# Patient Record
Sex: Male | Born: 1945 | Race: White | Hispanic: No | Smoking: Never smoker
Health system: Southern US, Community
[De-identification: ages and names within clinical notes are randomized; demographics above are authoritative.]

## PROBLEM LIST (undated history)

## (undated) DIAGNOSIS — F32A Depression, unspecified: Secondary | ICD-10-CM

## (undated) DIAGNOSIS — E785 Hyperlipidemia, unspecified: Secondary | ICD-10-CM

## (undated) DIAGNOSIS — F329 Major depressive disorder, single episode, unspecified: Secondary | ICD-10-CM

## (undated) HISTORY — DX: Hyperlipidemia, unspecified: E78.5

## (undated) HISTORY — DX: Depression, unspecified: F32.A

## (undated) HISTORY — PX: TONSILLECTOMY: SUR1361

---
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
## 2019-01-07 ENCOUNTER — Encounter: Payer: Self-pay | Admitting: Internal Medicine

## 2019-01-07 ENCOUNTER — Other Ambulatory Visit: Payer: Self-pay

## 2019-01-07 ENCOUNTER — Ambulatory Visit: Payer: Medicare Other | Admitting: Internal Medicine

## 2019-01-07 DIAGNOSIS — F329 Major depressive disorder, single episode, unspecified: Secondary | ICD-10-CM | POA: Insufficient documentation

## 2019-01-07 DIAGNOSIS — F32A Depression, unspecified: Secondary | ICD-10-CM | POA: Insufficient documentation

## 2019-01-07 DIAGNOSIS — F333 Major depressive disorder, recurrent, severe with psychotic symptoms: Secondary | ICD-10-CM | POA: Diagnosis not present

## 2019-01-07 DIAGNOSIS — E785 Hyperlipidemia, unspecified: Secondary | ICD-10-CM | POA: Diagnosis not present

## 2019-01-07 NOTE — Progress Notes (Signed)
New Patient Office Visit     CC/Reason for Visit: Establish care, discuss chronic medical conditions Previous PCP: At [facility] Last Visit: Fall 2019  HPI: Alexander Price is a 73 y.o. male who is coming in today for the above mentioned reasons. Past Medical History is significant for: Hyperlipidemia that has been well controlled on 40 mg of simvastatin.  His main history is psychiatric.  He has a [place] history of depression.  In 2019 he had a hospitalization in November and another one in February 2020 due to suicidal intent.  He has a [place] established relationship with a psychiatrist and a neurologist in [place].  His daughter accompanies him on his visit today.  He already has follow-up scheduled with local psychiatry in November.  Since his neurologist is only seeing him once a year, they will continue to visit him in [place].  He moved from [place] to [place] to be closer to his daughter due to his mental health issues.  He is living in independent living at [facility].  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  He is a never smoker, does not drink alcohol, has no drug allergies, his past surgical history is only significant for tonsillectomy in childhood, no family history of significance.  He suffered a dog bite to his left forearm 2 weeks ago, visited urgent care who gave him a tetanus booster and a course of amoxicillin which she has completed.  Apparently the dog's owner was able to provide proof of immunization against rabies.   Past Medical/Surgical History: Past Medical History:  Diagnosis Date  . Depression   . Hyperlipidemia     Past Surgical History:  Procedure Laterality Date  . TONSILLECTOMY      Social History:  reports that he has never smoked. He has never used smokeless tobacco. He reports that he does not drink alcohol or use drugs.  Allergies: No Known Allergies  Family History:  No history of  cancer, heart disease, stroke.  Current Outpatient Medications:  .  donepezil (ARICEPT) 5 MG tablet, Take 5 mg by mouth at bedtime., Disp: , Rfl:  .  FLUoxetine (PROZAC) 20 MG tablet, Take 20 mg by mouth daily., Disp: , Rfl:  .  lithium carbonate (ESKALITH) 450 MG CR tablet, Take 450 mg by mouth daily., Disp: , Rfl:  .  lithium carbonate (LITHOBID) 300 MG CR tablet, Take 300 mg by mouth 2 (two) times daily., Disp: , Rfl:  .  mirtazapine (REMERON) 30 MG tablet, Take 30 mg by mouth at bedtime., Disp: , Rfl:  .  OLANZapine (ZYPREXA) 5 MG tablet, Take 5 mg by mouth at bedtime., Disp: , Rfl:  .  simvastatin (ZOCOR) 40 MG tablet, Take 40 mg by mouth at bedtime., Disp: , Rfl:   Review of Systems:  Constitutional: Denies fever, chills, diaphoresis, appetite change and fatigue.  HEENT: Denies photophobia, eye pain, redness, hearing loss, ear pain, congestion, sore throat, rhinorrhea, sneezing, mouth sores, trouble swallowing, neck pain, neck stiffness and tinnitus.   Respiratory: Denies SOB, DOE, cough, chest tightness,  and wheezing.   Cardiovascular: Denies chest pain, palpitations and leg swelling.  Gastrointestinal: Denies nausea, vomiting, abdominal pain, diarrhea, constipation, blood in stool and abdominal distention.  Genitourinary: Denies dysuria, urgency, frequency, hematuria, flank pain and difficulty urinating.  Endocrine: Denies: hot or cold intolerance, sweats, changes in hair or nails, polyuria, polydipsia. Musculoskeletal: Denies myalgias, back pain, joint  swelling, arthralgias and gait problem.  Skin: Denies pallor, rash and wound.  Neurological: Denies dizziness, seizures, syncope, weakness, light-headedness, numbness and headaches.  Hematological: Denies adenopathy. Easy bruising, personal or family bleeding history  Psychiatric/Behavioral: Denies suicidal ideation, mood changes, confusion, nervousness, sleep disturbance and agitation    Physical Exam: Vitals:   01/07/19 0947   BP: 102/64  Pulse: 68  Temp: (!) 97.3 F (36.3 C)  TempSrc: Temporal  SpO2: 94%  Weight: 181 lb 14.4 oz (82.5 kg)  Height: 6' (1.829 m)   Body mass index is 24.67 kg/m.  Constitutional: NAD, calm, comfortable Eyes: PERRL, lids and conjunctivae normal, wears corrective lenses ENMT: Mucous membranes are moist.  Respiratory: clear to auscultation bilaterally, no wheezing, no crackles. Normal respiratory effort. No accessory muscle use.  Cardiovascular: Regular rate and rhythm, no murmurs / rubs / gallops. No extremity edema. 2+ pedal pulses.  Musculoskeletal: no clubbing / cyanosis. No joint deformity upper and lower extremities. Good ROM, no contractures. Normal muscle tone.  Skin: no rashes, lesions, ulcers. No induration Neurologic: Grossly intact and nonfocal.  Psychiatric: Normal judgment and insight. Alert and oriented x 3. Normal mood.    Impression and Plan:  Hyperlipidemia, unspecified hyperlipidemia type -He is due for annual physical, he will schedule this, check lipids at that time.  Severe episode of recurrent major depressive disorder, with psychotic features (HCC) -On multiple psychotropic medications, followed by psychiatry and neurology.     Patient Instructions  -Nice seeing you today!!  -Schedule your annual physical in 4 weeks.     Chaya Jan, MD [facility]

## 2019-01-07 NOTE — Patient Instructions (Signed)
-  Nice seeing you today!!  -Schedule your annual physical in 4 weeks.

## 2019-02-09 ENCOUNTER — Other Ambulatory Visit: Payer: Self-pay

## 2019-02-09 ENCOUNTER — Ambulatory Visit (INDEPENDENT_AMBULATORY_CARE_PROVIDER_SITE_OTHER): Payer: Medicare Other | Admitting: Internal Medicine

## 2019-02-09 ENCOUNTER — Encounter: Payer: Self-pay | Admitting: Internal Medicine

## 2019-02-09 VITALS — BP 100/64 | HR 84 | Temp 97.9°F | Ht 71.0 in | Wt 184.8 lb

## 2019-02-09 DIAGNOSIS — E785 Hyperlipidemia, unspecified: Secondary | ICD-10-CM | POA: Diagnosis not present

## 2019-02-09 DIAGNOSIS — Z1211 Encounter for screening for malignant neoplasm of colon: Secondary | ICD-10-CM

## 2019-02-09 DIAGNOSIS — Z0000 Encounter for general adult medical examination without abnormal findings: Secondary | ICD-10-CM

## 2019-02-09 DIAGNOSIS — F333 Major depressive disorder, recurrent, severe with psychotic symptoms: Secondary | ICD-10-CM

## 2019-02-09 LAB — LIPID PANEL
Cholesterol: 185 mg/dL (ref 0–200)
HDL: 51 mg/dL
LDL Cholesterol: 113 mg/dL — ABNORMAL HIGH (ref 0–99)
NonHDL: 134.16
Total CHOL/HDL Ratio: 4
Triglycerides: 108 mg/dL (ref 0.0–149.0)
VLDL: 21.6 mg/dL (ref 0.0–40.0)

## 2019-02-09 LAB — COMPREHENSIVE METABOLIC PANEL
ALT: 20 U/L (ref 0–53)
AST: 22 U/L (ref 0–37)
Albumin: 4.2 g/dL (ref 3.5–5.2)
Alkaline Phosphatase: 93 U/L (ref 39–117)
BUN: 20 mg/dL (ref 6–23)
CO2: 27 mEq/L (ref 19–32)
Calcium: 9.4 mg/dL (ref 8.4–10.5)
Chloride: 105 mEq/L (ref 96–112)
Creatinine, Ser: 0.9 mg/dL (ref 0.40–1.50)
GFR: 82.49 mL/min (ref >60.00)
Glucose, Bld: 91 mg/dL (ref 70–99)
Potassium: 4.1 mEq/L (ref 3.5–5.1)
Sodium: 140 mEq/L (ref 135–145)
Total Bilirubin: 0.7 mg/dL (ref 0.2–1.2)
Total Protein: 6.9 g/dL (ref 6.0–8.3)

## 2019-02-09 LAB — TSH: TSH: 7.59 u[IU]/mL — ABNORMAL HIGH (ref 0.35–4.50)

## 2019-02-09 LAB — CBC WITH DIFFERENTIAL/PLATELET
Basophils Absolute: 0 10*3/uL (ref 0.0–0.1)
Basophils Relative: 0.6 % (ref 0.0–3.0)
Eosinophils Absolute: 0.2 10*3/uL (ref 0.0–0.7)
Eosinophils Relative: 3.5 % (ref 0.0–5.0)
HCT: 43 % (ref 39.0–52.0)
Hemoglobin: 14.3 g/dL (ref 13.0–17.0)
Lymphocytes Relative: 22 % (ref 12.0–46.0)
Lymphs Abs: 1.3 10*3/uL (ref 0.7–4.0)
MCHC: 33.3 g/dL (ref 30.0–36.0)
MCV: 98.2 fl (ref 78.0–100.0)
Monocytes Absolute: 0.5 10*3/uL (ref 0.1–1.0)
Monocytes Relative: 8.6 % (ref 3.0–12.0)
Neutro Abs: 3.8 10*3/uL (ref 1.4–7.7)
Neutrophils Relative %: 65.3 % (ref 43.0–77.0)
Platelets: 241 10*3/uL (ref 150.0–400.0)
RBC: 4.38 Mil/uL (ref 4.22–5.81)
RDW: 12.8 % (ref 11.5–15.5)
WBC: 5.8 10*3/uL (ref 4.0–10.5)

## 2019-02-09 LAB — VITAMIN B12: Vitamin B-12: 514 pg/mL (ref 211–911)

## 2019-02-09 LAB — VITAMIN D 25 HYDROXY (VIT D DEFICIENCY, FRACTURES): VITD: 42.87 ng/mL (ref 30.00–100.00)

## 2019-02-09 LAB — HEMOGLOBIN A1C: Hgb A1c MFr Bld: 5.1 % (ref 4.6–6.5)

## 2019-02-09 MED ORDER — SIMVASTATIN 40 MG PO TABS: 40.0000 mg | ORAL_TABLET | Freq: Every day | ORAL | 1 refills | Status: DC

## 2019-02-09 NOTE — Progress Notes (Signed)
Established Patient Office Visit     CC/Reason for Visit: Annual preventive exam and subsequent Medicare wellness visit  HPI: Alexander Price is a 73 y.o. male who is coming in today for the above mentioned reasons. Past Medical History is significant for: Hyperlipidemia and severe depression managed by psychiatry.  His mood has been stable lately per his report, he seems upbeat and cheerful today.  He has no acute complaints.  He has routine dental care, has not seen an eye doctor in over 2 years.  Only has minor issues with hearing, he exercises every day by walking a mile plus.  He is due for colonoscopy.   Past Medical/Surgical History: Past Medical History:  Diagnosis Date  . Depression   . Hyperlipidemia     Past Surgical History:  Procedure Laterality Date  . TONSILLECTOMY      Social History:  reports that he has never smoked. He has never used smokeless tobacco. He reports that he does not drink alcohol or use drugs.  Allergies: No Known Allergies  Family History:  No history of heart disease, cancer, stroke that he is aware of.  Current Outpatient Medications:  .  donepezil (ARICEPT) 5 MG tablet, Take 5 mg by mouth at bedtime., Disp: , Rfl:  .  FLUoxetine (PROZAC) 20 MG tablet, Take 20 mg by mouth daily., Disp: , Rfl:  .  lithium carbonate (ESKALITH) 450 MG CR tablet, Take 450 mg by mouth daily., Disp: , Rfl:  .  lithium carbonate (LITHOBID) 300 MG CR tablet, Take 300 mg by mouth 2 (two) times daily., Disp: , Rfl:  .  mirtazapine (REMERON) 30 MG tablet, Take 30 mg by mouth at bedtime., Disp: , Rfl:  .  OLANZapine (ZYPREXA) 5 MG tablet, Take 5 mg by mouth at bedtime., Disp: , Rfl:  .  simvastatin (ZOCOR) 40 MG tablet, Take 1 tablet (40 mg total) by mouth at bedtime., Disp: 90 tablet, Rfl: 1  Review of Systems:  Constitutional: Denies fever, chills, diaphoresis, appetite change and fatigue.  HEENT: Denies photophobia, eye pain, redness, hearing loss, ear  pain, congestion, sore throat, rhinorrhea, sneezing, mouth sores, trouble swallowing, neck pain, neck stiffness and tinnitus.   Respiratory: Denies SOB, DOE, cough, chest tightness,  and wheezing.   Cardiovascular: Denies chest pain, palpitations and leg swelling.  Gastrointestinal: Denies nausea, vomiting, abdominal pain, diarrhea, constipation, blood in stool and abdominal distention.  Genitourinary: Denies dysuria, urgency, frequency, hematuria, flank pain and difficulty urinating.  Endocrine: Denies: hot or cold intolerance, sweats, changes in hair or nails, polyuria, polydipsia. Musculoskeletal: Denies myalgias, back pain, joint swelling, arthralgias and gait problem.  Skin: Denies pallor, rash and wound.  Neurological: Denies dizziness, seizures, syncope, weakness, light-headedness, numbness and headaches.  Hematological: Denies adenopathy. Easy bruising, personal or family bleeding history  Psychiatric/Behavioral: Denies suicidal ideation, mood changes, confusion, nervousness, sleep disturbance and agitation    Physical Exam: Vitals:   02/09/19 0725  BP: 100/64  Pulse: 84  Temp: 97.9 F (36.6 C)  TempSrc: Temporal  SpO2: 96%  Weight: 184 lb 12.8 oz (83.8 kg)  Height: 5' 11"  (1.803 m)    Body mass index is 25.77 kg/m.   Constitutional: NAD, calm, comfortable Eyes: PERRL, lids and conjunctivae normal, wears corrective lenses ENMT: Mucous membranes are moist.  Tympanic membrane is pearly white, no erythema or bulging. Neck: normal, supple, no masses, no thyromegaly Respiratory: clear to auscultation bilaterally, no wheezing, no crackles. Normal respiratory effort. No accessory muscle use.  Cardiovascular: Regular rate and rhythm, no murmurs / rubs / gallops. No extremity edema. 2+ pedal pulses. No carotid bruits.  Abdomen: no tenderness, no masses palpated. No hepatosplenomegaly. Bowel sounds positive.  Musculoskeletal: no clubbing / cyanosis. No joint deformity upper and  lower extremities. Good ROM, no contractures. Normal muscle tone.  Skin: no rashes, lesions, ulcers. No induration Neurologic: CN 2-12 grossly intact. Sensation intact, DTR normal. Strength 5/5 in all 4.  Psychiatric: Normal judgment and insight. Alert and oriented x 3. Normal mood.    Subsequent Medicare wellness visit   1. Risk factors, based on past  M,S,F -cardiovascular disease risk factors include age, gender, history of hyperlipidemia   2.  Physical activities: Walks over a mile every day   3.  Depression/mood:  Although he has a history of severe depression followed by psychiatry, mood appears stable today   4.  Hearing:  Minor issues only   5.  ADL's: Independent in all ADLs, lives at a senior living facility   6.  Fall risk:  Low fall risk   7.  Home safety: No problems identified   8.  Height weight, and visual acuity: Height and weight as above, visual acuity is 20/32 with each eye independently and together   9.  Counseling:  Advised continued follow-up with psychiatry   10. Lab orders based on risk factors: Laboratory update will be reviewed   11. Referral :  None today   12. Care plan:  Follow-up with me in 6 months   13. Cognitive assessment:  Only minor cognitive impairment identified   14. Screening: Patient provided with a written and personalized 5-10 year screening schedule in the AVS.   yes   15. Provider List Update:   PCP, psychiatry in [place]  16. Advance Directives: Full code     Office Visit from 01/07/2019 in [place] at [facility]  PHQ-9 Total Score  0      Fall Risk  01/07/2019  Falls in the past year? 1  Number falls in past yr: 1  Injury with Fall? 0     Impression and Plan:  Encounter for preventive health examination -Has routine dental care, have advised routine eye care. -Immunizations are up-to-date with the exception of shingles which he has been encouraged to obtain at his pharmacy. -Screening labs today.  -Healthy lifestyle has been discussed in detail. -Is overdue for screening colonoscopy, will refer to GI, previous colonoscopies were done while he was living in [facility]. -We have discussed colon cancer screening today, he is not high risk, will defer screening this year.  Hyperlipidemia, unspecified hyperlipidemia type  -Check lipids, refill simvastatin.  Severe episode of recurrent major depressive disorder, with psychotic features ([place])  -Followed by psychiatry in [facility], at their request will add lithium level to his physical labs and forward results to them.    Patient Instructions  -Nice seeing you today!!  -Lab work today; will notify you once results are available.  -Schedule follow up in 6 months.   Preventive Care 19 Years and Older, Male Preventive care refers to lifestyle choices and visits with your health care provider that can promote health and wellness. This includes:  A yearly physical exam. This is also called an annual well check.  Regular dental and eye exams.  Immunizations.  Screening for certain conditions.  Healthy lifestyle choices, such as diet and exercise. What can I expect for my preventive care visit? Physical exam Your health care provider will  check:  Height and weight. These may be used to calculate body mass index (BMI), which is a measurement that tells if you are at a healthy weight.  Heart rate and blood pressure.  Your skin for abnormal spots. Counseling Your health care provider may ask you questions about:  Alcohol, tobacco, and drug use.  Emotional well-being.  Home and relationship well-being.  Sexual activity.  Eating habits.  History of falls.  Memory and ability to understand (cognition).  Work and work Statistician. What immunizations do I need?  Influenza (flu) vaccine  This is recommended every year. Tetanus, diphtheria, and pertussis (Tdap) vaccine  You may need a Td booster every 10 years.  Varicella (chickenpox) vaccine  You may need this vaccine if you have not already been vaccinated. Zoster (shingles) vaccine  You may need this after age 74. Pneumococcal conjugate (PCV13) vaccine  One dose is recommended after age 21. Pneumococcal polysaccharide (PPSV23) vaccine  One dose is recommended after age 75. Measles, mumps, and rubella (MMR) vaccine  You may need at least one dose of MMR if you were born in 1957 or later. You may also need a second dose. Meningococcal conjugate (MenACWY) vaccine  You may need this if you have certain conditions. Hepatitis A vaccine  You may need this if you have certain conditions or if you travel or work in places where you may be exposed to hepatitis A. Hepatitis B vaccine  You may need this if you have certain conditions or if you travel or work in places where you may be exposed to hepatitis B. Haemophilus influenzae type b (Hib) vaccine  You may need this if you have certain conditions. You may receive vaccines as individual doses or as more than one vaccine together in one shot (combination vaccines). Talk with your health care provider about the risks and benefits of combination vaccines. What tests do I need? Blood tests  Lipid and cholesterol levels. These may be checked every 5 years, or more frequently depending on your overall health.  Hepatitis C test.  Hepatitis B test. Screening  Lung cancer screening. You may have this screening every year starting at age 65 if you have a 30-pack-year history of smoking and currently smoke or have quit within the past 15 years.  Colorectal cancer screening. All adults should have this screening starting at age 63 and continuing until age 11. Your health care provider may recommend screening at age 77 if you are at increased risk. You will have tests every 1-10 years, depending on your results and the type of screening test.  Prostate cancer screening. Recommendations will vary  depending on your family history and other risks.  Diabetes screening. This is done by checking your blood sugar (glucose) after you have not eaten for a while (fasting). You may have this done every 1-3 years.  Abdominal aortic aneurysm (AAA) screening. You may need this if you are a current or former smoker.  Sexually transmitted disease (STD) testing. Follow these instructions at home: Eating and drinking  Eat a diet that includes fresh fruits and vegetables, whole grains, lean protein, and low-fat dairy products. Limit your intake of foods with high amounts of sugar, saturated fats, and salt.  Take vitamin and mineral supplements as recommended by your health care provider.  Do not drink alcohol if your health care provider tells you not to drink.  If you drink alcohol: ? Limit how much you have to 0-2 drinks a day. ? Be aware of  how much alcohol is in your drink. In the [place] one drink equals one 12 oz bottle of beer (355 mL), one 5 oz glass of wine (148 mL), or one 1 oz glass of hard liquor (44 mL). Lifestyle  Take daily care of your teeth and gums.  Stay active. Exercise for at least 30 minutes on 5 or more days each week.  Do not use any products that contain nicotine or tobacco, such as cigarettes, e-cigarettes, and chewing tobacco. If you need help quitting, ask your health care provider.  If you are sexually active, practice safe sex. Use a condom or other form of protection to prevent STIs (sexually transmitted infections).  Talk with your health care provider about taking a low-dose aspirin or statin. What's next?  Visit your health care provider once a year for a well check visit.  Ask your health care provider how often you should have your eyes and teeth checked.  Stay up to date on all vaccines. This information is not intended to replace advice given to you by your health care provider. Make sure you discuss any questions you have with your health care  provider. Document Released: 04/07/2015 Document Revised: 03/05/2018 Document Reviewed: 03/05/2018 Elsevier Patient Education  2020 [place], MD [facility]

## 2019-02-09 NOTE — Patient Instructions (Addendum)
-Nice seeing you today!!  -Lab work today; will notify you once results are available.  -Schedule follow up in 6 months.   Preventive Care 65 Years and Older, Male Preventive care refers to lifestyle choices and visits with your health care provider that can promote health and wellness. This includes:  A yearly physical exam. This is also called an annual well check.  Regular dental and eye exams.  Immunizations.  Screening for certain conditions.  Healthy lifestyle choices, such as diet and exercise. What can I expect for my preventive care visit? Physical exam Your health care provider will check:  Height and weight. These may be used to calculate body mass index (BMI), which is a measurement that tells if you are at a healthy weight.  Heart rate and blood pressure.  Your skin for abnormal spots. Counseling Your health care provider may ask you questions about:  Alcohol, tobacco, and drug use.  Emotional well-being.  Home and relationship well-being.  Sexual activity.  Eating habits.  History of falls.  Memory and ability to understand (cognition).  Work and work environment. What immunizations do I need?  Influenza (flu) vaccine  This is recommended every year. Tetanus, diphtheria, and pertussis (Tdap) vaccine  You may need a Td booster every 10 years. Varicella (chickenpox) vaccine  You may need this vaccine if you have not already been vaccinated. Zoster (shingles) vaccine  You may need this after age 60. Pneumococcal conjugate (PCV13) vaccine  One dose is recommended after age 65. Pneumococcal polysaccharide (PPSV23) vaccine  One dose is recommended after age 65. Measles, mumps, and rubella (MMR) vaccine  You may need at least one dose of MMR if you were born in 1957 or later. You may also need a second dose. Meningococcal conjugate (MenACWY) vaccine  You may need this if you have certain conditions. Hepatitis A vaccine  You may need  this if you have certain conditions or if you travel or work in places where you may be exposed to hepatitis A. Hepatitis B vaccine  You may need this if you have certain conditions or if you travel or work in places where you may be exposed to hepatitis B. Haemophilus influenzae type b (Hib) vaccine  You may need this if you have certain conditions. You may receive vaccines as individual doses or as more than one vaccine together in one shot (combination vaccines). Talk with your health care provider about the risks and benefits of combination vaccines. What tests do I need? Blood tests  Lipid and cholesterol levels. These may be checked every 5 years, or more frequently depending on your overall health.  Hepatitis C test.  Hepatitis B test. Screening  Lung cancer screening. You may have this screening every year starting at age 55 if you have a 30-pack-year history of smoking and currently smoke or have quit within the past 15 years.  Colorectal cancer screening. All adults should have this screening starting at age 50 and continuing until age 75. Your health care provider may recommend screening at age 45 if you are at increased risk. You will have tests every 1-10 years, depending on your results and the type of screening test.  Prostate cancer screening. Recommendations will vary depending on your family history and other risks.  Diabetes screening. This is done by checking your blood sugar (glucose) after you have not eaten for a while (fasting). You may have this done every 1-3 years.  Abdominal aortic aneurysm (AAA) screening. You may need this   if you are a current or former smoker.  Sexually transmitted disease (STD) testing. Follow these instructions at home: Eating and drinking  Eat a diet that includes fresh fruits and vegetables, whole grains, lean protein, and low-fat dairy products. Limit your intake of foods with high amounts of sugar, saturated fats, and salt.  Take  vitamin and mineral supplements as recommended by your health care provider.  Do not drink alcohol if your health care provider tells you not to drink.  If you drink alcohol: ? Limit how much you have to 0-2 drinks a day. ? Be aware of how much alcohol is in your drink. In the [place] one drink equals one 12 oz bottle of beer (355 mL), one 5 oz glass of wine (148 mL), or one 1 oz glass of hard liquor (44 mL). Lifestyle  Take daily care of your teeth and gums.  Stay active. Exercise for at least 30 minutes on 5 or more days each week.  Do not use any products that contain nicotine or tobacco, such as cigarettes, e-cigarettes, and chewing tobacco. If you need help quitting, ask your health care provider.  If you are sexually active, practice safe sex. Use a condom or other form of protection to prevent STIs (sexually transmitted infections).  Talk with your health care provider about taking a low-dose aspirin or statin. What's next?  Visit your health care provider once a year for a well check visit.  Ask your health care provider how often you should have your eyes and teeth checked.  Stay up to date on all vaccines. This information is not intended to replace advice given to you by your health care provider. Make sure you discuss any questions you have with your health care provider. Document Released: 04/07/2015 Document Revised: 03/05/2018 Document Reviewed: 03/05/2018 Elsevier Patient Education  2020 [employer] Inc[employer]   

## 2019-02-10 ENCOUNTER — Other Ambulatory Visit: Payer: Self-pay | Admitting: Internal Medicine

## 2019-02-10 ENCOUNTER — Telehealth: Payer: Self-pay | Admitting: Internal Medicine

## 2019-02-10 DIAGNOSIS — R7989 Other specified abnormal findings of blood chemistry: Secondary | ICD-10-CM

## 2019-02-10 LAB — LITHIUM LEVEL: Lithium Lvl: 0.7 mmol/L (ref 0.6–1.2)

## 2019-02-10 NOTE — Telephone Encounter (Signed)
Patient states someone called him and wanted a call back- he was returning the call.  He stated he wanted a copy of his labs mailed to him at his house.  He also said if he got a call back to please say the name and telephone number slowly so he could understand and write it down.

## 2019-02-11 NOTE — Telephone Encounter (Signed)
Patient is aware.  Copy mailed to home address per patient request.

## 2019-02-15 ENCOUNTER — Telehealth: Payer: Self-pay | Admitting: Internal Medicine

## 2019-02-15 NOTE — Telephone Encounter (Signed)
Patient's daughter called requesting from him that he wanted a copy of his most recent lab work mailed to his address on file.  I confirmed the address with her.

## 2019-02-16 NOTE — Telephone Encounter (Signed)
Mailed to home address per patient request. 

## 2019-02-25 ENCOUNTER — Other Ambulatory Visit: Payer: Self-pay

## 2019-02-25 ENCOUNTER — Telehealth: Payer: Self-pay | Admitting: Internal Medicine

## 2019-02-25 ENCOUNTER — Other Ambulatory Visit (INDEPENDENT_AMBULATORY_CARE_PROVIDER_SITE_OTHER): Payer: Medicare Other

## 2019-02-25 DIAGNOSIS — R7989 Other specified abnormal findings of blood chemistry: Secondary | ICD-10-CM | POA: Diagnosis not present

## 2019-02-25 LAB — T3, FREE: T3, Free: 2.7 pg/mL (ref 2.3–4.2)

## 2019-02-25 LAB — TSH: TSH: 3.47 u[IU]/mL (ref 0.35–4.50)

## 2019-02-25 NOTE — Telephone Encounter (Signed)
Patient needs Rx refilled  simvastatin (ZOCOR) 40 MG tablet    Sent to [facility] on [address]

## 2019-02-25 NOTE — Telephone Encounter (Signed)
Spoke with patient. Informed patient that on 11/17 PCP sent Rx for a quantity of 90 with 1 refill. Patient verbalized understanding and will check with pharmacy

## 2019-03-11 ENCOUNTER — Encounter: Payer: Self-pay | Admitting: Internal Medicine

## 2019-04-23 ENCOUNTER — Ambulatory Visit: Payer: Medicare Other

## 2019-05-01 ENCOUNTER — Ambulatory Visit: Payer: Medicare Other

## 2019-05-14 ENCOUNTER — Ambulatory Visit: Payer: Medicare Other

## 2019-09-16 ENCOUNTER — Other Ambulatory Visit: Payer: Self-pay | Admitting: Internal Medicine

## 2019-09-16 DIAGNOSIS — E785 Hyperlipidemia, unspecified: Secondary | ICD-10-CM

## 2019-11-08 ENCOUNTER — Other Ambulatory Visit: Payer: Self-pay

## 2019-11-08 ENCOUNTER — Ambulatory Visit (INDEPENDENT_AMBULATORY_CARE_PROVIDER_SITE_OTHER): Payer: Medicare PPO | Admitting: Family Medicine

## 2019-11-08 ENCOUNTER — Encounter: Payer: Self-pay | Admitting: Family Medicine

## 2019-11-08 VITALS — BP 118/60 | HR 65 | Temp 98.1°F | Wt 166.6 lb

## 2019-11-08 DIAGNOSIS — H6123 Impacted cerumen, bilateral: Secondary | ICD-10-CM

## 2019-11-08 NOTE — Progress Notes (Signed)
° °  Subjective:    Patient ID: Alexander Price, male    DOB: Jan 30, 1946, 74 y.o.   MRN: [id number]  HPI Here to have his ears cleaned out. He was being evaluated for hearing aids, and he was told his ears are full of wax. He has never had them cleaned out before.    Review of Systems  Constitutional: Negative.   HENT: Positive for hearing loss. Negative for ear pain.   Respiratory: Negative.   Cardiovascular: Negative.        Objective:   Physical Exam Constitutional:      Appearance: Normal appearance.  HENT:     Ears:     Comments: Both ear canals are full of cerumen  Cardiovascular:     Rate and Rhythm: Normal rate and regular rhythm.     Pulses: Normal pulses.     Heart sounds: Normal heart sounds.  Pulmonary:     Effort: Pulmonary effort is normal.     Breath sounds: Normal breath sounds.  Neurological:     Mental Status: He is alert.           Assessment & Plan:  Cerumen impactions. We were able to clear out the right canal but the left canal has cerumen deep inside, almost up against the TM. We will refer him to ENT to remove this.  Gershon Crane, MD

## 2019-12-28 DIAGNOSIS — R2689 Other abnormalities of gait and mobility: Secondary | ICD-10-CM | POA: Diagnosis not present

## 2019-12-30 ENCOUNTER — Telehealth: Payer: Self-pay | Admitting: Internal Medicine

## 2019-12-30 DIAGNOSIS — F332 Major depressive disorder, recurrent severe without psychotic features: Secondary | ICD-10-CM | POA: Diagnosis not present

## 2019-12-30 NOTE — Telephone Encounter (Signed)
Pt's psychiatrist is putting patient on a new medication that he needs an EKG for.  They will possibly need orders and to schedule an appt.  Psychiatrist- Ellis Savage                      [facility]

## 2019-12-31 DIAGNOSIS — R2689 Other abnormalities of gait and mobility: Secondary | ICD-10-CM | POA: Diagnosis not present

## 2020-01-01 NOTE — Telephone Encounter (Signed)
Ok to schedule visit for EKG, but I will need more information as to what the medication is. Thanks.

## 2020-01-03 NOTE — Telephone Encounter (Signed)
Spoke with the pts daughter Ms Orland Jarred and offered to schedule an appt with Dr Ardyth Harps as below.  She stated she is driving at this time, will check her schedule and call back for an appt.

## 2020-01-04 ENCOUNTER — Encounter: Payer: Self-pay | Admitting: Family Medicine

## 2020-01-04 ENCOUNTER — Other Ambulatory Visit: Payer: Self-pay

## 2020-01-04 ENCOUNTER — Ambulatory Visit (INDEPENDENT_AMBULATORY_CARE_PROVIDER_SITE_OTHER): Payer: Medicare PPO | Admitting: Family Medicine

## 2020-01-04 VITALS — BP 108/62 | HR 61 | Temp 98.2°F | Ht 71.0 in | Wt 166.0 lb

## 2020-01-04 DIAGNOSIS — R2689 Other abnormalities of gait and mobility: Secondary | ICD-10-CM | POA: Diagnosis not present

## 2020-01-04 DIAGNOSIS — T887XXA Unspecified adverse effect of drug or medicament, initial encounter: Secondary | ICD-10-CM

## 2020-01-04 DIAGNOSIS — F333 Major depressive disorder, recurrent, severe with psychotic symptoms: Secondary | ICD-10-CM | POA: Diagnosis not present

## 2020-01-04 NOTE — Progress Notes (Signed)
   Subjective:    Patient ID: Alexander Price, male    DOB: [birth year], 74 y.o.   MRN: [id number]  HPI Here with his daughter for an EKG. He sees Ellis Savage NP for depression and she wants to start him on Luvox. She asked that he get an EKG first to make sure he does not have a prolonged QT interval. He feels fine.    Review of Systems  Constitutional: Negative.   Respiratory: Negative.   Cardiovascular: Negative.        Objective:   Physical Exam Constitutional:      Appearance: Normal appearance.  Cardiovascular:     Rate and Rhythm: Normal rate and regular rhythm.     Pulses: Normal pulses.     Heart sounds: Normal heart sounds.     Comments: EKG is within normal limits, normal QT interval  Pulmonary:     Effort: Pulmonary effort is normal.     Breath sounds: Normal breath sounds.  Neurological:     Mental Status: He is alert.           Assessment & Plan:  His EKG is normal, so they will relay this information to his psychiatrist.  Gershon Crane, MD

## 2020-01-06 DIAGNOSIS — R2689 Other abnormalities of gait and mobility: Secondary | ICD-10-CM | POA: Diagnosis not present

## 2020-01-10 DIAGNOSIS — R2689 Other abnormalities of gait and mobility: Secondary | ICD-10-CM | POA: Diagnosis not present

## 2020-01-11 DIAGNOSIS — F331 Major depressive disorder, recurrent, moderate: Secondary | ICD-10-CM | POA: Diagnosis not present

## 2020-01-12 DIAGNOSIS — R2689 Other abnormalities of gait and mobility: Secondary | ICD-10-CM | POA: Diagnosis not present

## 2020-01-17 DIAGNOSIS — R2689 Other abnormalities of gait and mobility: Secondary | ICD-10-CM | POA: Diagnosis not present

## 2020-01-19 DIAGNOSIS — R2689 Other abnormalities of gait and mobility: Secondary | ICD-10-CM | POA: Diagnosis not present

## 2020-01-26 DIAGNOSIS — R2689 Other abnormalities of gait and mobility: Secondary | ICD-10-CM | POA: Diagnosis not present

## 2020-02-14 DIAGNOSIS — F330 Major depressive disorder, recurrent, mild: Secondary | ICD-10-CM | POA: Diagnosis not present

## 2020-03-08 DIAGNOSIS — Z79899 Other long term (current) drug therapy: Secondary | ICD-10-CM | POA: Diagnosis not present

## 2020-03-24 ENCOUNTER — Other Ambulatory Visit: Payer: Self-pay | Admitting: Internal Medicine

## 2020-03-24 DIAGNOSIS — E785 Hyperlipidemia, unspecified: Secondary | ICD-10-CM

## 2020-04-16 ENCOUNTER — Other Ambulatory Visit: Payer: Self-pay | Admitting: Internal Medicine

## 2020-04-16 DIAGNOSIS — E785 Hyperlipidemia, unspecified: Secondary | ICD-10-CM

## 2020-09-11 ENCOUNTER — Other Ambulatory Visit: Payer: Self-pay | Admitting: Internal Medicine

## 2020-09-11 DIAGNOSIS — E785 Hyperlipidemia, unspecified: Secondary | ICD-10-CM

## 2020-10-10 DIAGNOSIS — F429 Obsessive-compulsive disorder, unspecified: Secondary | ICD-10-CM | POA: Diagnosis not present

## 2020-10-10 DIAGNOSIS — F3341 Major depressive disorder, recurrent, in partial remission: Secondary | ICD-10-CM | POA: Diagnosis not present

## 2020-11-30 DIAGNOSIS — Z79899 Other long term (current) drug therapy: Secondary | ICD-10-CM | POA: Diagnosis not present

## 2020-11-30 LAB — LIPID PANEL
Cholesterol: 161 (ref 0–200)
HDL: 45 (ref 35–70)
LDL Cholesterol: 19
Triglycerides: 101 (ref 40–160)

## 2020-11-30 LAB — CBC AND DIFFERENTIAL
HCT: 45 (ref 41–53)
Hemoglobin: 15.2 (ref 13.5–17.5)
Neutrophils Absolute: 83
Platelets: 209 (ref 150–399)
WBC: 9.1

## 2020-11-30 LAB — BASIC METABOLIC PANEL
BUN: 15 (ref 4–21)
CO2: 23 — AB (ref 13–22)
Chloride: 104 (ref 99–108)
Creatinine: 1.1 (ref 0.6–1.3)
Glucose: 92
Potassium: 4.3 (ref 3.4–5.3)
Sodium: 140 (ref 137–147)

## 2020-11-30 LAB — TSH: TSH: 5.28 (ref <=5.90)

## 2020-11-30 LAB — HEPATIC FUNCTION PANEL
ALT: 26 (ref 10–40)
AST: 23 (ref 14–40)
Alkaline Phosphatase: 108 (ref 25–125)
Bilirubin, Total: 0.5

## 2020-11-30 LAB — CBC: RBC: 4.75 (ref 3.87–5.11)

## 2020-11-30 LAB — COMPREHENSIVE METABOLIC PANEL
Albumin: 4.2 (ref 3.5–5.0)
Calcium: 9.3 (ref 8.7–10.7)
Globulin: 2.3

## 2020-12-12 ENCOUNTER — Telehealth: Payer: Self-pay | Admitting: Internal Medicine

## 2020-12-12 ENCOUNTER — Other Ambulatory Visit: Payer: Self-pay | Admitting: Internal Medicine

## 2020-12-12 DIAGNOSIS — E785 Hyperlipidemia, unspecified: Secondary | ICD-10-CM

## 2020-12-12 NOTE — Telephone Encounter (Signed)
Pt daughter call and stated she faxed over some labs for his chart and wanted dr. Ardyth Harps to know.

## 2020-12-13 NOTE — Telephone Encounter (Signed)
Placed in Dr Hernandez's folder 

## 2020-12-15 ENCOUNTER — Encounter: Payer: Self-pay | Admitting: Internal Medicine

## 2021-01-01 ENCOUNTER — Telehealth: Payer: Self-pay | Admitting: Internal Medicine

## 2021-01-01 NOTE — Telephone Encounter (Signed)
Patient's daughter [name] called to schedule patient an appointment for a physical. She states that patient had an appointment with his psychiatrist about two weeks ago and when they got the results back, his thyroid was off. She is dropping off the lab results in office for Dr. Ardyth Harps to review. Labs will be placed in notes folder.  [name] is wanting a call at [phone] once labs are reviewed and to know what next steps her father should take to regulate his thyroid levels.

## 2021-01-02 NOTE — Telephone Encounter (Signed)
Pt's daughter informed of the message and verbalized understanding  

## 2021-01-05 DIAGNOSIS — Z23 Encounter for immunization: Secondary | ICD-10-CM | POA: Diagnosis not present

## 2021-01-10 ENCOUNTER — Other Ambulatory Visit: Payer: Self-pay | Admitting: Internal Medicine

## 2021-01-10 DIAGNOSIS — E785 Hyperlipidemia, unspecified: Secondary | ICD-10-CM

## 2021-01-14 ENCOUNTER — Emergency Department (HOSPITAL_BASED_OUTPATIENT_CLINIC_OR_DEPARTMENT_OTHER): Payer: Medicare PPO

## 2021-01-14 ENCOUNTER — Emergency Department (HOSPITAL_BASED_OUTPATIENT_CLINIC_OR_DEPARTMENT_OTHER)
Admission: EM | Admit: 2021-01-14 | Discharge: 2021-01-14 | Disposition: A | Discharge status: Home / Self Care | Payer: Medicare PPO | Attending: Emergency Medicine | Admitting: Emergency Medicine

## 2021-01-14 ENCOUNTER — Encounter (HOSPITAL_BASED_OUTPATIENT_CLINIC_OR_DEPARTMENT_OTHER): Payer: Self-pay

## 2021-01-14 ENCOUNTER — Other Ambulatory Visit: Payer: Self-pay

## 2021-01-14 DIAGNOSIS — R1031 Right lower quadrant pain: Secondary | ICD-10-CM

## 2021-01-14 DIAGNOSIS — T148XXA Other injury of unspecified body region, initial encounter: Secondary | ICD-10-CM

## 2021-01-14 DIAGNOSIS — Z79899 Other long term (current) drug therapy: Secondary | ICD-10-CM | POA: Insufficient documentation

## 2021-01-14 DIAGNOSIS — R109 Unspecified abdominal pain: Secondary | ICD-10-CM | POA: Diagnosis not present

## 2021-01-14 DIAGNOSIS — Z7982 Long term (current) use of aspirin: Secondary | ICD-10-CM | POA: Diagnosis not present

## 2021-01-14 DIAGNOSIS — N200 Calculus of kidney: Secondary | ICD-10-CM | POA: Diagnosis not present

## 2021-01-14 LAB — COMPREHENSIVE METABOLIC PANEL
ALT: 24 U/L (ref 0–44)
AST: 18 U/L (ref 15–41)
Albumin: 4 g/dL (ref 3.5–5.0)
Alkaline Phosphatase: 91 U/L (ref 38–126)
Anion gap: 8 (ref 5–15)
BUN: 21 mg/dL (ref 8–23)
CO2: 26 mmol/L (ref 22–32)
Calcium: 9.4 mg/dL (ref 8.9–10.3)
Chloride: 104 mmol/L (ref 98–111)
Creatinine, Ser: 1.29 mg/dL — ABNORMAL HIGH (ref 0.61–1.24)
GFR, Estimated: 58 mL/min — ABNORMAL LOW (ref >60)
Glucose, Bld: 97 mg/dL (ref 70–99)
Potassium: 4 mmol/L (ref 3.5–5.1)
Sodium: 138 mmol/L (ref 135–145)
Total Bilirubin: 0.3 mg/dL (ref 0.3–1.2)
Total Protein: 6.8 g/dL (ref 6.5–8.1)

## 2021-01-14 LAB — DIFFERENTIAL
Abs Immature Granulocytes: 0.02 10*3/uL (ref 0.00–0.07)
Basophils Absolute: 0.1 10*3/uL (ref 0.0–0.1)
Basophils Relative: 1 %
Eosinophils Absolute: 0.1 10*3/uL (ref 0.0–0.5)
Eosinophils Relative: 2 %
Immature Granulocytes: 0 %
Lymphocytes Relative: 12 %
Lymphs Abs: 1 10*3/uL (ref 0.7–4.0)
Monocytes Absolute: 0.6 10*3/uL (ref 0.1–1.0)
Monocytes Relative: 7 %
Neutro Abs: 6.6 10*3/uL (ref 1.7–7.7)
Neutrophils Relative %: 78 %

## 2021-01-14 LAB — CBC
HCT: 44.2 % (ref 39.0–52.0)
Hemoglobin: 14.3 g/dL (ref 13.0–17.0)
MCH: 31.7 pg (ref 26.0–34.0)
MCHC: 32.4 g/dL (ref 30.0–36.0)
MCV: 98 fL (ref 80.0–100.0)
Platelets: 222 10*3/uL (ref 150–400)
RBC: 4.51 MIL/uL (ref 4.22–5.81)
RDW: 12.3 % (ref 11.5–15.5)
WBC: 8.3 10*3/uL (ref 4.0–10.5)
nRBC: 0 % (ref 0.0–0.2)

## 2021-01-14 LAB — LIPASE, BLOOD: Lipase: 47 U/L (ref 11–51)

## 2021-01-14 MED ORDER — CYCLOBENZAPRINE HCL 5 MG PO TABS: 5.0000 mg | ORAL_TABLET | Freq: Two times a day (BID) | ORAL | 0 refills | Status: AC | PRN

## 2021-01-14 MED ORDER — CYCLOBENZAPRINE HCL 5 MG PO TABS
5.0000 mg | ORAL_TABLET | Freq: Once | ORAL | Status: AC
  Administered 2021-01-14: 5 mg via ORAL
  Filled 2021-01-14: qty 1

## 2021-01-14 MED ORDER — ONDANSETRON HCL 4 MG/2ML IJ SOLN
4.0000 mg | Freq: Once | INTRAMUSCULAR | Status: AC
  Administered 2021-01-14: 4 mg via INTRAVENOUS
  Filled 2021-01-14: qty 2

## 2021-01-14 MED ORDER — IOHEXOL 300 MG/ML  SOLN
100.0000 mL | Freq: Once | INTRAMUSCULAR | Status: AC | PRN
  Administered 2021-01-14: 100 mL via INTRAVENOUS

## 2021-01-14 MED ORDER — MORPHINE SULFATE (PF) 4 MG/ML IV SOLN
4.0000 mg | Freq: Once | INTRAVENOUS | Status: AC
  Administered 2021-01-14: 4 mg via INTRAVENOUS
  Filled 2021-01-14: qty 1

## 2021-01-14 NOTE — ED Provider Notes (Signed)
[facility] EMERGENCY DEPT Provider Note   CSN: [id number] Arrival date & time: 01/14/21  1709     History Chief Complaint  Patient presents with   Abdominal Pain   Groin Pain    Alexander Price is a 75 y.o. male.   Abdominal Pain Associated symptoms: no chest pain, no chills, no cough, no dysuria, no fever, no hematuria, no nausea, no shortness of breath, no sore throat and no vomiting   Groin Pain Associated symptoms include abdominal pain. Pertinent negatives include no chest pain and no shortness of breath.   Patient presents with right lower quadrant pain and right groin pain.  It started acutely last night, the pain is constant but aggravated by getting from a sitting to standing position.  He has not tried any alleviating factors, the pain does not move elsewhere.  Denies any dysuria, testicular swelling, rashes, hematuria, nausea, vomiting, back pain.  The patient is never had any abdominal surgeries.  The patient had a bowel movement yesterday, has not passed gas or had a bowel movement today.  Past Medical History:  Diagnosis Date   Depression    sees Ellis Savage NP at [facility]    Hyperlipidemia     Patient Active Problem List   Diagnosis Date Noted   Hyperlipidemia    Depression     Past Surgical History:  Procedure Laterality Date   TONSILLECTOMY         No family history on file.  Social History   Tobacco Use   Smoking status: Never   Smokeless tobacco: Never  Substance Use Topics   Alcohol use: Never   Drug use: Never    Home Medications Prior to Admission medications   Medication Sig Start Date End Date Taking? Authorizing Provider  aspirin EC 81 MG tablet Take 81 mg by mouth daily. Swallow whole.   Yes [provider]  buPROPion (WELLBUTRIN XL) 300 MG 24 hr tablet Take 300 mg by mouth daily.   Yes [provider]  donepezil (ARICEPT) 5 MG tablet Take 10 mg by mouth at bedtime.   Yes [provider]  fluvoxaMINE (LUVOX) 100 MG tablet Take 200 mg by mouth at bedtime.   Yes [provider]  lithium carbonate (ESKALITH) 450 MG CR tablet Take 450 mg by mouth daily.   Yes [provider]  lithium carbonate (LITHOBID) 300 MG CR tablet Take 300 mg by mouth 2 (two) times daily.   Yes [provider]  OLANZapine (ZYPREXA) 5 MG tablet Take 2.5 mg by mouth at bedtime.   Yes [provider]  simvastatin (ZOCOR) 40 MG tablet TAKE 1 TABLET BY MOUTH EVERYDAY AT BEDTIME 01/11/21  Yes Philip Aspen, Limmie Patricia, MD  FLUoxetine (PROZAC) 20 MG tablet Take 20 mg by mouth daily.    [provider]  mirtazapine (REMERON) 30 MG tablet Take 15 mg by mouth at bedtime.    [provider]    Allergies    Patient has no known allergies.  Review of Systems   Review of Systems  Constitutional:  Negative for chills and fever.  HENT:  Negative for ear pain and sore throat.   Eyes:  Negative for pain and visual disturbance.  Respiratory:  Negative for cough and shortness of breath.   Cardiovascular:  Negative for chest pain and palpitations.  Gastrointestinal:  Positive for abdominal pain. Negative for nausea and vomiting.  Genitourinary:  Positive for testicular pain. Negative for difficulty urinating, dysuria,  hematuria, penile discharge and scrotal swelling.  Musculoskeletal:  Negative for arthralgias and back pain.  Skin:  Negative for color change and rash.  Neurological:  Negative for seizures and syncope.  All other systems reviewed and are negative.  Physical Exam Updated Vital Signs BP (!) 144/98 (BP Location: Right Arm)   Pulse 73   Temp 98.1 F (36.7 C) (Oral)   Resp 18   Ht 5\' 11"  (1.803 m)   Wt 75.3 kg   SpO2 100%   BMI 23.15 kg/m   Physical Exam Vitals and nursing note reviewed. Exam conducted with a chaperone present.  Constitutional:      General: He is not in acute distress.    Appearance: Normal appearance.  HENT:      Head: Normocephalic and atraumatic.  Eyes:     General: No scleral icterus.       Right eye: No discharge.        Left eye: No discharge.     Extraocular Movements: Extraocular movements intact.     Pupils: Pupils are equal, round, and reactive to light.  Cardiovascular:     Rate and Rhythm: Normal rate and regular rhythm.     Pulses: Normal pulses.     Heart sounds: Normal heart sounds. No murmur heard.   No friction rub. No gallop.  Pulmonary:     Effort: Pulmonary effort is normal. No respiratory distress.     Breath sounds: Normal breath sounds.  Abdominal:     General: Abdomen is flat. Bowel sounds are normal. There is no distension.     Palpations: Abdomen is soft.     Tenderness: There is abdominal tenderness in the right lower quadrant. There is no right CVA tenderness or left CVA tenderness.  Genitourinary:    Penis: Normal.      Testes: Normal. Cremasteric reflex is present.        Right: Mass, tenderness or swelling not present.        Left: Mass, tenderness or swelling not present.     Comments: No chaperone was present during the genital exam.  No swelling, both testicles descended.  No erythema, no urethral swelling or erythema or discharge.  Did not palpate an inguinal hernia. Skin:    General: Skin is warm and dry.     Coloration: Skin is not jaundiced.  Neurological:     Mental Status: He is alert. Mental status is at baseline.     Coordination: Coordination normal.   ED Results / Procedures / Treatments   Labs (all labs ordered are listed, but only abnormal results are displayed) Labs Reviewed  LIPASE, BLOOD  COMPREHENSIVE METABOLIC PANEL  CBC  URINALYSIS, ROUTINE W REFLEX MICROSCOPIC  DIFFERENTIAL    EKG None  Radiology No results found.  Procedures Procedures   Medications Ordered in ED Medications  ondansetron (ZOFRAN) injection 4 mg (has no administration in time range)  morphine 4 MG/ML injection 4 mg (has no administration in time  range)    ED Course  I have reviewed the triage vital signs and the nursing notes.  Pertinent labs & imaging results that were available during my care of the patient were reviewed by me and considered in my medical decision making (see chart for details).  Clinical Course as of 01/14/21 2006  Sun Jan 14, 2021  1924 Comprehensive metabolic panel(!) Patient is a slight bump in creatinine, otherwise no gross electrolyte derangement or abnormal LFTs. [HS]  1924 CBC No leukocytosis, no anemia [  HS]  1924 Lipase, blood Lipase within normal limits, this in the setting of somebody without epigastric pain radiating to the back and without nausea and vomiting makes pancreatitis highly unlikely. [HS]    Clinical Course User Index [HS] Theron Arista, PA-C   MDM Rules/Calculators/A&P                           Patient vitals are stable, he is not in any acute distress.  He does have some tenderness to the right lower quadrant, but he states it is not as significant as when he tries to go from sitting to standing.  He denies any reproducibility, also denies reproducibility of the tenderness in his right testicle.  The general exam is reassuring, cremaster reflex intact.  No overlying skin changes, no urethritis, no discharge.  No tenderness to palpation of the testicles bilaterally, patient points to the right inguinal area as well as right lower quadrant of his abdomen as far as pain so I do not think this is a testicular torsion.  We will start with labs, CT abdomen and proceed from there.  Please see ED course for interpretation of labs as they pertain to the differential.  There is a nonobstructing kidney stone on the CT, no acute findings.  No appendicitis, I still doubt acute torsion although I considered it..  The pain is inguinal, worsened by movement.  I suspect the pain is likely musculoskeletal.  Will discharge patient with antiinflammatory medicine and have him follow-up with his PCP.  Patient  discharged in stable position.  Discussed HPI, physical exam and plan of care for this patient with attending Alvira Monday. The attending physician evaluated this patient as part of a shared visit and agrees with plan of care.   Final Clinical Impression(s) / ED Diagnoses Final diagnoses:  None    Rx / DC Orders ED Discharge Orders     None        Theron Arista, Cordelia Poche 01/14/21 2007    Alvira Monday, MD 01/15/21 1836

## 2021-01-14 NOTE — ED Notes (Signed)
Rolly Salter, PA-C in room w/pt now.

## 2021-01-14 NOTE — ED Notes (Signed)
Pt unable to provide urine sample at this time 

## 2021-01-14 NOTE — ED Triage Notes (Signed)
Pt reports waking up this am with Right groin pain and RLQ. Pt eating, drinking and urinating with no difficulty. Last BM was yesterday. Pt took 2 ibuprofen tablets with no relief. Pt denies hx of hernia

## 2021-01-16 ENCOUNTER — Other Ambulatory Visit: Payer: Self-pay

## 2021-01-17 ENCOUNTER — Encounter: Payer: Self-pay | Admitting: Internal Medicine

## 2021-01-17 ENCOUNTER — Ambulatory Visit (INDEPENDENT_AMBULATORY_CARE_PROVIDER_SITE_OTHER): Payer: Medicare PPO | Admitting: Internal Medicine

## 2021-01-17 VITALS — BP 136/88 | HR 64 | Temp 98.5°F | Ht 71.0 in | Wt 192.2 lb

## 2021-01-17 DIAGNOSIS — E785 Hyperlipidemia, unspecified: Secondary | ICD-10-CM

## 2021-01-17 DIAGNOSIS — F333 Major depressive disorder, recurrent, severe with psychotic symptoms: Secondary | ICD-10-CM

## 2021-01-17 DIAGNOSIS — Z0000 Encounter for general adult medical examination without abnormal findings: Secondary | ICD-10-CM | POA: Diagnosis not present

## 2021-01-17 DIAGNOSIS — R7989 Other specified abnormal findings of blood chemistry: Secondary | ICD-10-CM

## 2021-01-17 DIAGNOSIS — R2689 Other abnormalities of gait and mobility: Secondary | ICD-10-CM | POA: Diagnosis not present

## 2021-01-17 DIAGNOSIS — M6281 Muscle weakness (generalized): Secondary | ICD-10-CM | POA: Diagnosis not present

## 2021-01-17 DIAGNOSIS — R278 Other lack of coordination: Secondary | ICD-10-CM | POA: Diagnosis not present

## 2021-01-17 LAB — T3, FREE: T3, Free: 2.9 pg/mL (ref 2.3–4.2)

## 2021-01-17 LAB — TSH: TSH: 7.25 u[IU]/mL — ABNORMAL HIGH (ref 0.35–5.50)

## 2021-01-17 LAB — T4, FREE: Free T4: 0.81 ng/dL (ref 0.60–1.60)

## 2021-01-17 NOTE — Patient Instructions (Signed)
-  Nice seeing you today!! ? ?-Lab work today; will notify you once results are available. ? ?-Remember your COVID booster and shingles vaccines at the pharmacy. ? ?-Schedule follow up in 6 months. ? ? ?

## 2021-01-17 NOTE — Progress Notes (Signed)
Established Patient Office Visit     This visit occurred during the SARS-CoV-2 public health emergency.  Safety protocols were in place, including screening questions prior to the visit, additional usage of staff PPE, and extensive cleaning of exam room while observing appropriate contact time as indicated for disinfecting solutions.    CC/Reason for Visit: Annual preventive exam and subsequent Medicare wellness visit  HPI: Alexander Price is a 75 y.o. male who is coming in today for the above mentioned reasons. Past Medical History is significant for: Longstanding history of depression followed by psychiatry and hyperlipidemia.  He has routine eye and dental care.  He wears hearing aids, he lives at [facility].  His TSH was abnormal with his psychiatrist recently.  They wanted him to come back and see me for this.  He had his flu vaccine in October.  He has had 4 COVID vaccines but not the most recent booster.   Past Medical/Surgical History: Past Medical History:  Diagnosis Date   Depression    sees Ellis Savage NP at [facility]    Hyperlipidemia     Past Surgical History:  Procedure Laterality Date   TONSILLECTOMY      Social History:  reports that he has never smoked. He has never used smokeless tobacco. He reports that he does not drink alcohol and does not use drugs.  Allergies: No Known Allergies  Family History:  History reviewed. No pertinent family history.   Current Outpatient Medications:    aspirin EC 81 MG tablet, Take 81 mg by mouth daily. Swallow whole., Disp: , Rfl:    buPROPion (WELLBUTRIN XL) 300 MG 24 hr tablet, Take 300 mg by mouth daily., Disp: , Rfl:    cyclobenzaprine (FLEXERIL) 5 MG tablet, Take 1-2 tablets (5-10 mg total) by mouth 2 (two) times daily as needed for up to 10 days for muscle spasms., Disp: 20 tablet, Rfl: 0   donepezil (ARICEPT) 5 MG tablet, Take 10 mg by mouth at bedtime., Disp: , Rfl:    fluvoxaMINE (LUVOX) 100 MG  tablet, Take 200 mg by mouth at bedtime., Disp: , Rfl:    lithium carbonate (ESKALITH) 450 MG CR tablet, Take 450 mg by mouth daily., Disp: , Rfl:    lithium carbonate (LITHOBID) 300 MG CR tablet, Take 300 mg by mouth 2 (two) times daily., Disp: , Rfl:    Melatonin 2.5 MG CHEW, Chew by mouth daily., Disp: , Rfl:    mirtazapine (REMERON) 30 MG tablet, Take 15 mg by mouth at bedtime., Disp: , Rfl:    OLANZapine (ZYPREXA) 5 MG tablet, Take 2.5 mg by mouth at bedtime., Disp: , Rfl:    simvastatin (ZOCOR) 40 MG tablet, TAKE 1 TABLET BY MOUTH EVERYDAY AT BEDTIME, Disp: 90 tablet, Rfl: 1  Review of Systems:  Constitutional: Denies fever, chills, diaphoresis, appetite change and fatigue.  HEENT: Denies photophobia, eye pain, redness, hearing loss, ear pain, congestion, sore throat, rhinorrhea, sneezing, mouth sores, trouble swallowing, neck pain, neck stiffness and tinnitus.   Respiratory: Denies SOB, DOE, cough, chest tightness,  and wheezing.   Cardiovascular: Denies chest pain, palpitations and leg swelling.  Gastrointestinal: Denies nausea, vomiting, abdominal pain, diarrhea, constipation, blood in stool and abdominal distention.  Genitourinary: Denies dysuria, urgency, frequency, hematuria, flank pain and difficulty urinating.  Endocrine: Denies: hot or cold intolerance, sweats, changes in hair or nails, polyuria, polydipsia. Musculoskeletal: Denies myalgias, back pain, joint swelling, arthralgias and gait problem.  Skin: Denies pallor, rash  and wound.  Neurological: Denies dizziness, seizures, syncope, weakness, light-headedness, numbness and headaches.  Hematological: Denies adenopathy. Easy bruising, personal or family bleeding history  Psychiatric/Behavioral: Denies suicidal ideation, mood changes, confusion, nervousness, sleep disturbance and agitation    Physical Exam: Vitals:   01/17/21 1334  BP: 136/88  Pulse: 64  Temp: 98.5 F (36.9 C)  TempSrc: Oral  SpO2: 94%  Weight: 192 lb  3.2 oz (87.2 kg)  Height: 5\' 11"  (1.803 m)    Body mass index is 26.81 kg/m.   Constitutional: NAD, calm, comfortable Eyes: PERRL, lids and conjunctivae normal wears corrective lenses. ENMT: Mucous membranes are moist. Posterior pharynx clear of any exudate or lesions. Normal dentition. Tympanic membrane is pearly white, no erythema or bulging. Neck: normal, supple, no masses, no thyromegaly Respiratory: clear to auscultation bilaterally, no wheezing, no crackles. Normal respiratory effort. No accessory muscle use.  Cardiovascular: Regular rate and rhythm, no murmurs / rubs / gallops. No extremity edema. 2+ pedal pulses. No carotid bruits.  Abdomen: no tenderness, no masses palpated. No hepatosplenomegaly. Bowel sounds positive.  Musculoskeletal: no clubbing / cyanosis. No joint deformity upper and lower extremities. Good ROM, no contractures. Normal muscle tone.  Skin: no rashes, lesions, ulcers. No induration Neurologic: CN 2-12 grossly intact. Sensation intact, DTR normal. Strength 5/5 in all 4.  Psychiatric: Normal judgment and insight. Alert and oriented x 3. Normal mood.   Subsequent Medicare wellness visit   1. Risk factors, based on past  M,S,F -cardiovascular disease risk factors include age, gender, history of hyperlipidemia   2.  Physical activities: Sedentary   3.  Depression/mood: Significant depression   4.  Hearing: Wears bilateral hearing aids   5.  ADL's: Independent in all ADLs   6.  Fall risk: Low fall risk   7.  Home safety: No problems identified   8.  Height weight, and visual acuity: height and weight as above, vision:  Vision Screening   Right eye Left eye Both eyes  Without correction 20/32 20/40 20/40   With correction        9.  Counseling: Advised recheck of thyroid function, advised he update his COVID booster and shingles vaccines at pharmacy   10. Lab orders based on risk factors: Laboratory update will be reviewed   11. Referral : None  today   12. Care plan: Follow-up with me in 6 months   13. Cognitive assessment: No cognitive impairment   14. Screening: Patient provided with a written and personalized 5-10 year screening schedule in the AVS. yes   15. Provider List Update: PCP, psychiatry  16. Advance Directives: Full code   17. Opioids: Patient is not on any opioid prescriptions and has no risk factors for a substance use disorder.   Flowsheet Row Office Visit from 01/17/2021 in [facility]  PHQ-9 Total Score 18       Fall Risk 01/07/2019 01/14/2021 01/17/2021  Falls in the past year? 1 - 0  Was there an injury with Fall? 0 - 0  Fall Risk Category Calculator 2 - 0  Fall Risk Category Moderate - Low  Patient Fall Risk Level - Low fall risk -     Impression and Plan:  Encounter for preventive health examination -Recommend routine eye and dental care. -Immunizations: He is due his shingles series and COVID booster which she will get at pharmacy, otherwise immunizations are up-to-date. -Healthy lifestyle discussed in detail. -Labs to be updated today. -Colon cancer screening: Elects to defer due  to age and comorbidities -Breast cancer screening: Not applicable -Cervical cancer screening: Not applicable -Lung cancer screening: Not eligible -Prostate cancer screening: After shared decision making, we have elected not to pursue this -DEXA: Not applicable   Hyperlipidemia, unspecified hyperlipidemia type -Check lipids today, continue simvastatin. -Recent lipid panel showed a total cholesterol of 161, triglycerides 101 and LDL 19  Severe episode of recurrent major depressive disorder, with psychotic features (HCC) -He will follow-up with his psychiatrist.  Abnormal TSH  - Plan: T4, free, T3, free, TSH    Patient Instructions  -Nice seeing you today!!  -Lab work today; will notify you once results are available.  -Remember your COVID booster and shingles vaccines at the  pharmacy.  -Schedule follow up in 6 months.    Chaya Jan, MD [facility]

## 2021-01-19 ENCOUNTER — Telehealth: Payer: Self-pay | Admitting: Internal Medicine

## 2021-01-19 ENCOUNTER — Encounter: Payer: Self-pay | Admitting: Internal Medicine

## 2021-01-19 DIAGNOSIS — R7989 Other specified abnormal findings of blood chemistry: Secondary | ICD-10-CM

## 2021-01-19 NOTE — Telephone Encounter (Signed)
Pt daughter [name] is calling and would like her dad blood work results

## 2021-01-19 NOTE — Telephone Encounter (Signed)
Spoke with daughter.  See lab result note.

## 2021-01-22 DIAGNOSIS — R278 Other lack of coordination: Secondary | ICD-10-CM | POA: Diagnosis not present

## 2021-01-22 DIAGNOSIS — M6281 Muscle weakness (generalized): Secondary | ICD-10-CM | POA: Diagnosis not present

## 2021-01-23 DIAGNOSIS — R278 Other lack of coordination: Secondary | ICD-10-CM | POA: Diagnosis not present

## 2021-01-23 DIAGNOSIS — R2689 Other abnormalities of gait and mobility: Secondary | ICD-10-CM | POA: Diagnosis not present

## 2021-01-23 DIAGNOSIS — M6281 Muscle weakness (generalized): Secondary | ICD-10-CM | POA: Diagnosis not present

## 2021-01-24 DIAGNOSIS — M6281 Muscle weakness (generalized): Secondary | ICD-10-CM | POA: Diagnosis not present

## 2021-01-24 DIAGNOSIS — R2689 Other abnormalities of gait and mobility: Secondary | ICD-10-CM | POA: Diagnosis not present

## 2021-01-25 DIAGNOSIS — M6281 Muscle weakness (generalized): Secondary | ICD-10-CM | POA: Diagnosis not present

## 2021-01-25 DIAGNOSIS — R278 Other lack of coordination: Secondary | ICD-10-CM | POA: Diagnosis not present

## 2021-01-26 DIAGNOSIS — M6281 Muscle weakness (generalized): Secondary | ICD-10-CM | POA: Diagnosis not present

## 2021-01-26 DIAGNOSIS — R2689 Other abnormalities of gait and mobility: Secondary | ICD-10-CM | POA: Diagnosis not present

## 2021-01-29 DIAGNOSIS — R278 Other lack of coordination: Secondary | ICD-10-CM | POA: Diagnosis not present

## 2021-01-29 DIAGNOSIS — M6281 Muscle weakness (generalized): Secondary | ICD-10-CM | POA: Diagnosis not present

## 2021-01-29 DIAGNOSIS — R2689 Other abnormalities of gait and mobility: Secondary | ICD-10-CM | POA: Diagnosis not present

## 2021-01-31 DIAGNOSIS — R278 Other lack of coordination: Secondary | ICD-10-CM | POA: Diagnosis not present

## 2021-01-31 DIAGNOSIS — M6281 Muscle weakness (generalized): Secondary | ICD-10-CM | POA: Diagnosis not present

## 2021-02-01 DIAGNOSIS — M6281 Muscle weakness (generalized): Secondary | ICD-10-CM | POA: Diagnosis not present

## 2021-02-01 DIAGNOSIS — R2689 Other abnormalities of gait and mobility: Secondary | ICD-10-CM | POA: Diagnosis not present

## 2021-02-02 DIAGNOSIS — M6281 Muscle weakness (generalized): Secondary | ICD-10-CM | POA: Diagnosis not present

## 2021-02-02 DIAGNOSIS — R278 Other lack of coordination: Secondary | ICD-10-CM | POA: Diagnosis not present

## 2021-02-06 DIAGNOSIS — R278 Other lack of coordination: Secondary | ICD-10-CM | POA: Diagnosis not present

## 2021-02-06 DIAGNOSIS — M6281 Muscle weakness (generalized): Secondary | ICD-10-CM | POA: Diagnosis not present

## 2021-02-06 DIAGNOSIS — R2689 Other abnormalities of gait and mobility: Secondary | ICD-10-CM | POA: Diagnosis not present

## 2021-02-07 DIAGNOSIS — M6281 Muscle weakness (generalized): Secondary | ICD-10-CM | POA: Diagnosis not present

## 2021-02-07 DIAGNOSIS — R2689 Other abnormalities of gait and mobility: Secondary | ICD-10-CM | POA: Diagnosis not present

## 2021-02-07 DIAGNOSIS — R278 Other lack of coordination: Secondary | ICD-10-CM | POA: Diagnosis not present

## 2021-02-08 DIAGNOSIS — M6281 Muscle weakness (generalized): Secondary | ICD-10-CM | POA: Diagnosis not present

## 2021-02-08 DIAGNOSIS — R278 Other lack of coordination: Secondary | ICD-10-CM | POA: Diagnosis not present

## 2021-02-09 DIAGNOSIS — R2689 Other abnormalities of gait and mobility: Secondary | ICD-10-CM | POA: Diagnosis not present

## 2021-02-09 DIAGNOSIS — M6281 Muscle weakness (generalized): Secondary | ICD-10-CM | POA: Diagnosis not present

## 2021-02-11 DIAGNOSIS — M6281 Muscle weakness (generalized): Secondary | ICD-10-CM | POA: Diagnosis not present

## 2021-02-11 DIAGNOSIS — R278 Other lack of coordination: Secondary | ICD-10-CM | POA: Diagnosis not present

## 2021-02-12 DIAGNOSIS — M6281 Muscle weakness (generalized): Secondary | ICD-10-CM | POA: Diagnosis not present

## 2021-02-12 DIAGNOSIS — R2689 Other abnormalities of gait and mobility: Secondary | ICD-10-CM | POA: Diagnosis not present

## 2021-02-13 DIAGNOSIS — M6281 Muscle weakness (generalized): Secondary | ICD-10-CM | POA: Diagnosis not present

## 2021-02-13 DIAGNOSIS — R278 Other lack of coordination: Secondary | ICD-10-CM | POA: Diagnosis not present

## 2021-02-13 DIAGNOSIS — R2689 Other abnormalities of gait and mobility: Secondary | ICD-10-CM | POA: Diagnosis not present

## 2021-02-14 DIAGNOSIS — M6281 Muscle weakness (generalized): Secondary | ICD-10-CM | POA: Diagnosis not present

## 2021-02-14 DIAGNOSIS — R2689 Other abnormalities of gait and mobility: Secondary | ICD-10-CM | POA: Diagnosis not present

## 2021-02-20 DIAGNOSIS — F429 Obsessive-compulsive disorder, unspecified: Secondary | ICD-10-CM | POA: Diagnosis not present

## 2021-02-20 DIAGNOSIS — M6281 Muscle weakness (generalized): Secondary | ICD-10-CM | POA: Diagnosis not present

## 2021-02-20 DIAGNOSIS — R2689 Other abnormalities of gait and mobility: Secondary | ICD-10-CM | POA: Diagnosis not present

## 2021-02-20 DIAGNOSIS — F332 Major depressive disorder, recurrent severe without psychotic features: Secondary | ICD-10-CM | POA: Diagnosis not present

## 2021-02-22 DIAGNOSIS — M6281 Muscle weakness (generalized): Secondary | ICD-10-CM | POA: Diagnosis not present

## 2021-02-22 DIAGNOSIS — R278 Other lack of coordination: Secondary | ICD-10-CM | POA: Diagnosis not present

## 2021-02-22 DIAGNOSIS — R2689 Other abnormalities of gait and mobility: Secondary | ICD-10-CM | POA: Diagnosis not present

## 2021-02-23 DIAGNOSIS — M6281 Muscle weakness (generalized): Secondary | ICD-10-CM | POA: Diagnosis not present

## 2021-02-23 DIAGNOSIS — R2689 Other abnormalities of gait and mobility: Secondary | ICD-10-CM | POA: Diagnosis not present

## 2021-02-23 DIAGNOSIS — R278 Other lack of coordination: Secondary | ICD-10-CM | POA: Diagnosis not present

## 2021-02-27 DIAGNOSIS — M6281 Muscle weakness (generalized): Secondary | ICD-10-CM | POA: Diagnosis not present

## 2021-02-27 DIAGNOSIS — R278 Other lack of coordination: Secondary | ICD-10-CM | POA: Diagnosis not present

## 2021-02-27 DIAGNOSIS — R2689 Other abnormalities of gait and mobility: Secondary | ICD-10-CM | POA: Diagnosis not present

## 2021-02-28 DIAGNOSIS — M6281 Muscle weakness (generalized): Secondary | ICD-10-CM | POA: Diagnosis not present

## 2021-02-28 DIAGNOSIS — R2689 Other abnormalities of gait and mobility: Secondary | ICD-10-CM | POA: Diagnosis not present

## 2021-03-01 DIAGNOSIS — M6281 Muscle weakness (generalized): Secondary | ICD-10-CM | POA: Diagnosis not present

## 2021-03-01 DIAGNOSIS — R2689 Other abnormalities of gait and mobility: Secondary | ICD-10-CM | POA: Diagnosis not present

## 2021-03-02 DIAGNOSIS — R278 Other lack of coordination: Secondary | ICD-10-CM | POA: Diagnosis not present

## 2021-03-02 DIAGNOSIS — M6281 Muscle weakness (generalized): Secondary | ICD-10-CM | POA: Diagnosis not present

## 2021-03-05 DIAGNOSIS — R2689 Other abnormalities of gait and mobility: Secondary | ICD-10-CM | POA: Diagnosis not present

## 2021-03-05 DIAGNOSIS — M6281 Muscle weakness (generalized): Secondary | ICD-10-CM | POA: Diagnosis not present

## 2021-03-05 DIAGNOSIS — R278 Other lack of coordination: Secondary | ICD-10-CM | POA: Diagnosis not present

## 2021-03-06 DIAGNOSIS — M6281 Muscle weakness (generalized): Secondary | ICD-10-CM | POA: Diagnosis not present

## 2021-03-06 DIAGNOSIS — R278 Other lack of coordination: Secondary | ICD-10-CM | POA: Diagnosis not present

## 2021-03-07 DIAGNOSIS — R2689 Other abnormalities of gait and mobility: Secondary | ICD-10-CM | POA: Diagnosis not present

## 2021-03-07 DIAGNOSIS — M6281 Muscle weakness (generalized): Secondary | ICD-10-CM | POA: Diagnosis not present

## 2021-03-08 DIAGNOSIS — M6281 Muscle weakness (generalized): Secondary | ICD-10-CM | POA: Diagnosis not present

## 2021-03-08 DIAGNOSIS — R278 Other lack of coordination: Secondary | ICD-10-CM | POA: Diagnosis not present

## 2021-03-08 DIAGNOSIS — R2689 Other abnormalities of gait and mobility: Secondary | ICD-10-CM | POA: Diagnosis not present

## 2021-03-12 DIAGNOSIS — M6281 Muscle weakness (generalized): Secondary | ICD-10-CM | POA: Diagnosis not present

## 2021-03-12 DIAGNOSIS — R278 Other lack of coordination: Secondary | ICD-10-CM | POA: Diagnosis not present

## 2021-03-12 DIAGNOSIS — R2689 Other abnormalities of gait and mobility: Secondary | ICD-10-CM | POA: Diagnosis not present

## 2021-03-13 DIAGNOSIS — R2689 Other abnormalities of gait and mobility: Secondary | ICD-10-CM | POA: Diagnosis not present

## 2021-03-13 DIAGNOSIS — M6281 Muscle weakness (generalized): Secondary | ICD-10-CM | POA: Diagnosis not present

## 2021-03-21 ENCOUNTER — Other Ambulatory Visit (INDEPENDENT_AMBULATORY_CARE_PROVIDER_SITE_OTHER): Payer: Medicare PPO

## 2021-03-21 DIAGNOSIS — R7989 Other specified abnormal findings of blood chemistry: Secondary | ICD-10-CM

## 2021-03-21 LAB — T3, FREE: T3, Free: 3.3 pg/mL (ref 2.3–4.2)

## 2021-03-21 LAB — TSH: TSH: 5.94 u[IU]/mL — ABNORMAL HIGH (ref 0.35–5.50)

## 2021-03-21 LAB — T4, FREE: Free T4: 0.83 ng/dL (ref 0.60–1.60)

## 2021-04-13 DIAGNOSIS — F429 Obsessive-compulsive disorder, unspecified: Secondary | ICD-10-CM | POA: Diagnosis not present

## 2021-04-13 DIAGNOSIS — F332 Major depressive disorder, recurrent severe without psychotic features: Secondary | ICD-10-CM | POA: Diagnosis not present

## 2021-06-03 DIAGNOSIS — H6123 Impacted cerumen, bilateral: Secondary | ICD-10-CM | POA: Diagnosis not present

## 2021-06-03 DIAGNOSIS — H9193 Unspecified hearing loss, bilateral: Secondary | ICD-10-CM | POA: Diagnosis not present

## 2021-07-11 DIAGNOSIS — F332 Major depressive disorder, recurrent severe without psychotic features: Secondary | ICD-10-CM | POA: Diagnosis not present

## 2021-07-11 DIAGNOSIS — F429 Obsessive-compulsive disorder, unspecified: Secondary | ICD-10-CM | POA: Diagnosis not present

## 2021-08-04 ENCOUNTER — Other Ambulatory Visit: Payer: Self-pay

## 2021-08-04 ENCOUNTER — Emergency Department (HOSPITAL_BASED_OUTPATIENT_CLINIC_OR_DEPARTMENT_OTHER): Payer: Medicare PPO

## 2021-08-04 ENCOUNTER — Inpatient Hospital Stay (HOSPITAL_BASED_OUTPATIENT_CLINIC_OR_DEPARTMENT_OTHER)
Admission: EM | Admit: 2021-08-04 | Discharge: 2021-08-07 | DRG: 177 | Disposition: A | Discharge status: Home Health | Payer: Medicare PPO | Source: Skilled Nursing Facility | Attending: Internal Medicine | Admitting: Internal Medicine

## 2021-08-04 ENCOUNTER — Encounter (HOSPITAL_BASED_OUTPATIENT_CLINIC_OR_DEPARTMENT_OTHER): Payer: Self-pay | Admitting: Emergency Medicine

## 2021-08-04 DIAGNOSIS — M6282 Rhabdomyolysis: Secondary | ICD-10-CM | POA: Diagnosis not present

## 2021-08-04 DIAGNOSIS — I10 Essential (primary) hypertension: Secondary | ICD-10-CM | POA: Diagnosis present

## 2021-08-04 DIAGNOSIS — R0902 Hypoxemia: Secondary | ICD-10-CM | POA: Diagnosis not present

## 2021-08-04 DIAGNOSIS — Z91128 Patient's intentional underdosing of medication regimen for other reason: Secondary | ICD-10-CM

## 2021-08-04 DIAGNOSIS — F32A Depression, unspecified: Secondary | ICD-10-CM | POA: Diagnosis present

## 2021-08-04 DIAGNOSIS — R339 Retention of urine, unspecified: Secondary | ICD-10-CM | POA: Diagnosis present

## 2021-08-04 DIAGNOSIS — U071 COVID-19: Principal | ICD-10-CM | POA: Diagnosis present

## 2021-08-04 DIAGNOSIS — G9341 Metabolic encephalopathy: Secondary | ICD-10-CM | POA: Diagnosis not present

## 2021-08-04 DIAGNOSIS — Z043 Encounter for examination and observation following other accident: Secondary | ICD-10-CM | POA: Diagnosis not present

## 2021-08-04 DIAGNOSIS — R410 Disorientation, unspecified: Secondary | ICD-10-CM | POA: Diagnosis not present

## 2021-08-04 DIAGNOSIS — F333 Major depressive disorder, recurrent, severe with psychotic symptoms: Secondary | ICD-10-CM | POA: Diagnosis not present

## 2021-08-04 DIAGNOSIS — Z7982 Long term (current) use of aspirin: Secondary | ICD-10-CM

## 2021-08-04 DIAGNOSIS — R4182 Altered mental status, unspecified: Secondary | ICD-10-CM | POA: Diagnosis not present

## 2021-08-04 DIAGNOSIS — M47812 Spondylosis without myelopathy or radiculopathy, cervical region: Secondary | ICD-10-CM | POA: Diagnosis not present

## 2021-08-04 DIAGNOSIS — Z79899 Other long term (current) drug therapy: Secondary | ICD-10-CM

## 2021-08-04 DIAGNOSIS — E785 Hyperlipidemia, unspecified: Secondary | ICD-10-CM | POA: Diagnosis not present

## 2021-08-04 DIAGNOSIS — E860 Dehydration: Secondary | ICD-10-CM | POA: Diagnosis present

## 2021-08-04 DIAGNOSIS — G319 Degenerative disease of nervous system, unspecified: Secondary | ICD-10-CM | POA: Diagnosis not present

## 2021-08-04 LAB — COMPREHENSIVE METABOLIC PANEL
ALT: 23 U/L (ref 0–44)
AST: 58 U/L — ABNORMAL HIGH (ref 15–41)
Albumin: 4.1 g/dL (ref 3.5–5.0)
Alkaline Phosphatase: 94 U/L (ref 38–126)
Anion gap: 11 (ref 5–15)
BUN: 16 mg/dL (ref 8–23)
CO2: 28 mmol/L (ref 22–32)
Calcium: 9.2 mg/dL (ref 8.9–10.3)
Chloride: 101 mmol/L (ref 98–111)
Creatinine, Ser: 1.23 mg/dL (ref 0.61–1.24)
GFR, Estimated: >60 mL/min (ref >60)
Glucose, Bld: 96 mg/dL (ref 70–99)
Potassium: 3.8 mmol/L (ref 3.5–5.1)
Sodium: 140 mmol/L (ref 135–145)
Total Bilirubin: 0.5 mg/dL (ref 0.3–1.2)
Total Protein: 7.3 g/dL (ref 6.5–8.1)

## 2021-08-04 LAB — CBC
HCT: 47.1 % (ref 39.0–52.0)
Hemoglobin: 15 g/dL (ref 13.0–17.0)
MCH: 30.8 pg (ref 26.0–34.0)
MCHC: 31.8 g/dL (ref 30.0–36.0)
MCV: 96.7 fL (ref 80.0–100.0)
Platelets: 170 10*3/uL (ref 150–400)
RBC: 4.87 MIL/uL (ref 4.22–5.81)
RDW: 12.5 % (ref 11.5–15.5)
WBC: 7.2 10*3/uL (ref 4.0–10.5)
nRBC: 0 % (ref 0.0–0.2)

## 2021-08-04 LAB — CBG MONITORING, ED: Glucose-Capillary: 92 mg/dL (ref 70–99)

## 2021-08-04 LAB — RESP PANEL BY RT-PCR (FLU A&B, COVID) ARPGX2
Influenza A by PCR: NEGATIVE
Influenza B by PCR: NEGATIVE
SARS Coronavirus 2 by RT PCR: POSITIVE — AB

## 2021-08-04 LAB — URINALYSIS, ROUTINE W REFLEX MICROSCOPIC
Bilirubin Urine: NEGATIVE
Glucose, UA: NEGATIVE mg/dL
Ketones, ur: 40 mg/dL — AB
Leukocytes,Ua: NEGATIVE
Nitrite: NEGATIVE
Protein, ur: 100 mg/dL — AB
Specific Gravity, Urine: 1.026 (ref 1.005–1.030)
pH: 6 (ref 5.0–8.0)

## 2021-08-04 LAB — LACTIC ACID, PLASMA
Lactic Acid, Venous: 1.5 mmol/L (ref 0.5–1.9)
Lactic Acid, Venous: 1.7 mmol/L (ref 0.5–1.9)

## 2021-08-04 LAB — CK: Total CK: 1186 U/L — ABNORMAL HIGH (ref 49–397)

## 2021-08-04 MED ORDER — ACETAMINOPHEN 500 MG PO TABS
1000.0000 mg | ORAL_TABLET | Freq: Once | ORAL | Status: AC
Start: 2021-08-04 — End: 2021-08-04
  Administered 2021-08-04: 1000 mg via ORAL
  Filled 2021-08-04: qty 2

## 2021-08-04 MED ORDER — BENZONATATE 100 MG PO CAPS
100.0000 mg | ORAL_CAPSULE | Freq: Once | ORAL | Status: AC
  Administered 2021-08-04: 100 mg via ORAL
  Filled 2021-08-04: qty 1

## 2021-08-04 MED ORDER — SODIUM CHLORIDE 0.9 % IV BOLUS
500.0000 mL | Freq: Once | INTRAVENOUS | Status: AC
  Administered 2021-08-04: 500 mL via INTRAVENOUS

## 2021-08-04 NOTE — ED Provider Notes (Signed)
?[facility] EMERGENCY DEPT ?Provider Note ? ? ?CSN: [id number] ?Arrival date & time: 08/04/21  1240 ? ?  ? ?History ? ?Chief Complaint  ?Patient presents with  ? Fall  ? ? ?Priscille Heidelbergaul Erwin Bree is a 76 y.o. male. ? ?Patient is a 76 year old male who presents with confusion and cough.  He has a history of hypertension and hyperlipidemia.  He is on Aricept but per family he is he is  pretty alert and oriented.  He lives at an independent living facility.  His daughter went over to take him out to lunch today and found him on the bed without his clothes on.  He was more confused than normal.  He has had a cough for few days.  He told her that he had fallen during the night but he could not give any indication of what happened or how [place] he was laying on the floor.  He was able to get back onto the bed which is where the daughter found him.  He does not report any injuries from the fall.  He states had a cough for several days.  He has not been eating well.  The daughter states that he apparently has not gone to the dining hall to eat in the last couple of days.  He has not had any known fevers.  No vomiting or diarrhea.  No chest pain or shortness of breath. ? ? ?  ? ?Home Medications ?Prior to Admission medications   ?Medication Sig Start Date End Date Taking? Authorizing Provider  ?aspirin EC 81 MG tablet Take 81 mg by mouth daily. Swallow whole.    [provider]  ?buPROPion (WELLBUTRIN XL) 300 MG 24 hr tablet Take 300 mg by mouth daily.    [provider]  ?donepezil (ARICEPT) 5 MG tablet Take 10 mg by mouth at bedtime.    [provider]  ?fluvoxaMINE (LUVOX) 100 MG tablet Take 200 mg by mouth at bedtime.    [provider]  ?lithium carbonate (ESKALITH) 450 MG CR tablet Take 450 mg by mouth daily.    [provider]  ?lithium carbonate (LITHOBID) 300 MG CR tablet Take 300 mg by mouth 2 (two) times daily.    [provider]  ?Melatonin 2.5 MG CHEW  Chew by mouth daily.    [provider]  ?mirtazapine (REMERON) 30 MG tablet Take 15 mg by mouth at bedtime.    [provider]  ?OLANZapine (ZYPREXA) 5 MG tablet Take 2.5 mg by mouth at bedtime.    [provider]  ?simvastatin (ZOCOR) 40 MG tablet TAKE 1 TABLET BY MOUTH EVERYDAY AT BEDTIME 01/11/21   Philip AspenHernandez Acosta, Limmie PatriciaEstela Y, MD  ?   ? ?Allergies    ?Patient has no known allergies.   ? ?Review of Systems   ?Review of Systems  ?Constitutional:  Positive for fatigue. Negative for chills, diaphoresis and fever.  ?HENT:  Negative for congestion, rhinorrhea and sneezing.   ?Eyes: Negative.   ?Respiratory:  Positive for cough. Negative for chest tightness and shortness of breath.   ?Cardiovascular:  Negative for chest pain and leg swelling.  ?Gastrointestinal:  Negative for abdominal pain, blood in stool, diarrhea, nausea and vomiting.  ?Genitourinary:  Negative for difficulty urinating, flank pain, frequency and hematuria.  ?Musculoskeletal:  Negative for arthralgias and back pain.  ?Skin:  Negative for rash.  ?Neurological:  Positive for weakness (Generalized). Negative for dizziness, speech difficulty, numbness and headaches.  ?Psychiatric/Behavioral:  Positive for  confusion.   ? ?Physical Exam ?Updated Vital Signs ?BP 137/73   Pulse 67   Temp 100 ?F (37.8 ?C) (Rectal)   Resp 18   Ht 6' (1.829 m)   Wt 88.3 kg   SpO2 93%   BMI 26.40 kg/m?  ?Physical Exam ?Constitutional:   ?   Appearance: He is well-developed.  ?HENT:  ?   Head: Normocephalic and atraumatic.  ?Eyes:  ?   Pupils: Pupils are equal, round, and reactive to light.  ?Cardiovascular:  ?   Rate and Rhythm: Normal rate and regular rhythm.  ?   Heart sounds: Normal heart sounds.  ?Pulmonary:  ?   Effort: Pulmonary effort is normal. No respiratory distress.  ?   Breath sounds: Rhonchi present. No wheezing or rales.  ?Chest:  ?   Chest wall: No tenderness.  ?Abdominal:  ?   General: Bowel sounds are normal.  ?   Palpations:  Abdomen is soft.  ?   Tenderness: There is no abdominal tenderness. There is no guarding or rebound.  ?Musculoskeletal:     ?   General: Normal range of motion.  ?   Cervical back: Normal range of motion and neck supple.  ?Lymphadenopathy:  ?   Cervical: No cervical adenopathy.  ?Skin: ?   General: Skin is warm and dry.  ?   Findings: No rash.  ?Neurological:  ?   Mental Status: He is alert.  ?   Comments: Patient is oriented to person and place and year.  He is confused as to the month.  He is moving all extremities symmetrically without focal deficits.  ? ? ?ED Results / Procedures / Treatments   ?Labs ?(all labs ordered are listed, but only abnormal results are displayed) ?Labs Reviewed  ?RESP PANEL BY RT-PCR (FLU A&B, COVID) ARPGX2 - Abnormal; Notable for the following components:  ?    Result Value  ? SARS Coronavirus 2 by RT PCR POSITIVE (*)   ? All other components within normal limits  ?COMPREHENSIVE METABOLIC PANEL - Abnormal; Notable for the following components:  ? AST 58 (*)   ? All other components within normal limits  ?CK - Abnormal; Notable for the following components:  ? Total CK 1,186 (*)   ? All other components within normal limits  ?CBC  ?LACTIC ACID, PLASMA  ?URINALYSIS, ROUTINE W REFLEX MICROSCOPIC  ?LACTIC ACID, PLASMA  ?CBG MONITORING, ED  ? ? ?EKG ?EKG Interpretation ? ?Date/Time:  Saturday Aug 04 2021 13:19:53 EDT ?Ventricular Rate:  76 ?PR Interval:  164 ?QRS Duration: 94 ?QT Interval:  380 ?QTC Calculation: 427 ?R Axis:   87 ?Text Interpretation: Poor data quality, interpretation may be adversely affected Sinus rhythm with Premature atrial complexes Otherwise normal ECG No previous ECGs available Confirmed by Rolan Bucco [phone]) on 08/04/2021 2:16:33 PM ? ?Radiology ?CT Head Wo Contrast ? ?Result Date: 08/04/2021 ?CLINICAL DATA:  Altered mental status, fall EXAM: CT HEAD WITHOUT CONTRAST TECHNIQUE: Contiguous axial images were obtained from the base of the skull through the vertex  without intravenous contrast. RADIATION DOSE REDUCTION: This exam was performed according to the departmental dose-optimization program which includes automated exposure control, adjustment of the mA and/or kV according to patient size and/or use of iterative reconstruction technique. COMPARISON:  None Available. FINDINGS: Brain: No acute intracranial findings are seen in noncontrast CT brain. There are no signs of bleeding within the cranium. Ventricles are not dilated. Cortical sulci are prominent. Vascular: Unremarkable. Skull: Unremarkable. Sinuses/Orbits: There is mucosal  thickening in the ethmoid sinus. Other: None IMPRESSION: No acute intracranial findings are seen in noncontrast CT brain. Atrophy. Chronic ethmoid sinusitis. Electronically Signed   By: Ernie Avena M.D.   On: 08/04/2021 14:51  ? ?CT Cervical Spine Wo Contrast ? ?Result Date: 08/04/2021 ?CLINICAL DATA:  Neck trauma, fall EXAM: CT CERVICAL SPINE WITHOUT CONTRAST TECHNIQUE: Multidetector CT imaging of the cervical spine was performed without intravenous contrast. Multiplanar CT image reconstructions were also generated. RADIATION DOSE REDUCTION: This exam was performed according to the departmental dose-optimization program which includes automated exposure control, adjustment of the mA and/or kV according to patient size and/or use of iterative reconstruction technique. COMPARISON:  None Available. FINDINGS: Alignment: There is minimal 1 mm anterolisthesis at C3-C4 level. There is 2 mm anterolisthesis at C7-T1 level. These findings may be due to previous ligament injury and facet degeneration. Skull base and vertebrae: No recent fracture is seen. There is small 3 mm smooth marginated calcification posterior to the upper thoracic spine, possibly ligament calcification from previous injury. Degenerative changes are noted with bony spurs and facet hypertrophy at multiple levels. Soft tissues and spinal canal: There is extrinsic pressure over  the ventral margin of thecal sac caused by posterior bony spurs from C4-C7 levels causing mild to moderate spinal stenosis. Disc levels: There is encroachment of neural foramina by bony spurs and facet hypertrophy fr

## 2021-08-04 NOTE — ED Triage Notes (Addendum)
Family reports they went to check on him today and found out he fell sometime last night. Patient seemed confused, has a cough and urine was found all over the house. Patient reports that he has not been eating for a couple of days. ?

## 2021-08-04 NOTE — ED Notes (Signed)
[facility] has now arrived-- Pt awake - oriented to name and date  ?

## 2021-08-04 NOTE — ED Notes (Signed)
Have updated patient daughter, via telephone, on delay due to transport -- daughter mentions that pt has not taken his psych meds within last 2 days of arrival   ?

## 2021-08-04 NOTE — ED Notes (Signed)
Report received from [place], [place].  Pt lying in bed resting with eyes closed; RR even and unlabored on 2L O2 via [place] -- continuous cardiac and pulse ox maintained.  Isolation precautions in place secondary to patient covid+status.    ?

## 2021-08-04 NOTE — ED Notes (Signed)
Pt care taken, waiting for admission, no complaints at this time ?

## 2021-08-04 NOTE — ED Notes (Signed)
Called and gave report to [facility]. ?

## 2021-08-04 NOTE — ED Notes (Signed)
Patient transported to CT 

## 2021-08-04 NOTE — ED Notes (Signed)
Last covid vaccine was February 23, 2021. ?

## 2021-08-04 NOTE — Progress Notes (Signed)
Pt arrived to unit via [facility]. Pt awake and able to state name and birthday. Pt is otherwise disoriented at this time. Pt calm and cooperative but tremulous and weak. Pt Temp 103.2oral O2 sat 93%. Per [facility], pt was given 650 tylenol PO approx 20 min prior to arrival. Pt pupils 16mm bilat, pt able to track when asked but otherwise staring forward. Pt lung sounds are diminished R side, strong congested cough, he reports it is a little hard to breathe. Pt able to swallow water with no issues at this time.  ?

## 2021-08-05 ENCOUNTER — Inpatient Hospital Stay (HOSPITAL_COMMUNITY): Payer: Medicare PPO

## 2021-08-05 ENCOUNTER — Encounter (HOSPITAL_COMMUNITY): Payer: Self-pay | Admitting: Family Medicine

## 2021-08-05 DIAGNOSIS — U071 COVID-19: Secondary | ICD-10-CM | POA: Diagnosis not present

## 2021-08-05 DIAGNOSIS — M6282 Rhabdomyolysis: Secondary | ICD-10-CM | POA: Diagnosis not present

## 2021-08-05 LAB — RAPID URINE DRUG SCREEN, HOSP PERFORMED
Amphetamines: NOT DETECTED
Barbiturates: NOT DETECTED
Benzodiazepines: NOT DETECTED
Cocaine: NOT DETECTED
Opiates: NOT DETECTED
Tetrahydrocannabinol: NOT DETECTED

## 2021-08-05 LAB — HEPATIC FUNCTION PANEL
ALT: 22 U/L (ref 0–44)
AST: 49 U/L — ABNORMAL HIGH (ref 15–41)
Albumin: 3.1 g/dL — ABNORMAL LOW (ref 3.5–5.0)
Alkaline Phosphatase: 70 U/L (ref 38–126)
Bilirubin, Direct: 0.1 mg/dL (ref 0.0–0.2)
Indirect Bilirubin: 0.8 mg/dL (ref 0.3–0.9)
Total Bilirubin: 0.9 mg/dL (ref 0.3–1.2)
Total Protein: 6 g/dL — ABNORMAL LOW (ref 6.5–8.1)

## 2021-08-05 LAB — D-DIMER, QUANTITATIVE: D-Dimer, Quant: 1.32 ug{FEU}/mL — ABNORMAL HIGH (ref 0.00–0.50)

## 2021-08-05 LAB — TSH: TSH: 1.834 u[IU]/mL (ref 0.350–4.500)

## 2021-08-05 LAB — TROPONIN I (HIGH SENSITIVITY): Troponin I (High Sensitivity): 16 ng/L (ref <18)

## 2021-08-05 LAB — LITHIUM LEVEL: Lithium Lvl: 0.2 mmol/L — ABNORMAL LOW (ref 0.60–1.20)

## 2021-08-05 LAB — C-REACTIVE PROTEIN: CRP: 16.5 mg/dL — ABNORMAL HIGH

## 2021-08-05 LAB — CK: Total CK: 723 U/L — ABNORMAL HIGH (ref 49–397)

## 2021-08-05 MED ORDER — MOLNUPIRAVIR EUA 200MG CAPSULE
4.0000 | ORAL_CAPSULE | Freq: Two times a day (BID) | ORAL | Status: DC
  Administered 2021-08-05 – 2021-08-07 (×5): 800 mg via ORAL
  Filled 2021-08-05: qty 4

## 2021-08-05 MED ORDER — MIRTAZAPINE 15 MG PO TABS
15.0000 mg | ORAL_TABLET | Freq: Every day | ORAL | Status: DC
  Administered 2021-08-05 – 2021-08-06 (×2): 15 mg via ORAL
  Filled 2021-08-05 (×2): qty 1

## 2021-08-05 MED ORDER — ACETAMINOPHEN 650 MG RE SUPP
650.0000 mg | Freq: Four times a day (QID) | RECTAL | Status: DC | PRN
Start: 2021-08-05 — End: 2021-08-07

## 2021-08-05 MED ORDER — DONEPEZIL HCL 10 MG PO TABS
10.0000 mg | ORAL_TABLET | Freq: Every day | ORAL | Status: DC
  Administered 2021-08-05 – 2021-08-06 (×2): 10 mg via ORAL
  Filled 2021-08-05 (×2): qty 1

## 2021-08-05 MED ORDER — LITHIUM CARBONATE ER 300 MG PO TBCR
300.0000 mg | EXTENDED_RELEASE_TABLET | Freq: Every day | ORAL | Status: DC
Start: 2021-08-05 — End: 2021-08-07
  Administered 2021-08-05 – 2021-08-06 (×3): 300 mg via ORAL
  Filled 2021-08-05 (×4): qty 1

## 2021-08-05 MED ORDER — DULOXETINE HCL 60 MG PO CPEP
60.0000 mg | ORAL_CAPSULE | Freq: Every day | ORAL | Status: DC
  Administered 2021-08-05 – 2021-08-07 (×3): 60 mg via ORAL
  Filled 2021-08-05 (×3): qty 1

## 2021-08-05 MED ORDER — ACETAMINOPHEN 325 MG PO TABS
650.0000 mg | ORAL_TABLET | Freq: Four times a day (QID) | ORAL | Status: DC | PRN
  Administered 2021-08-05 – 2021-08-06 (×2): 650 mg via ORAL
  Filled 2021-08-05 (×2): qty 2

## 2021-08-05 MED ORDER — ENOXAPARIN SODIUM 40 MG/0.4ML IJ SOSY
40.0000 mg | PREFILLED_SYRINGE | INTRAMUSCULAR | Status: DC
  Administered 2021-08-05 – 2021-08-07 (×3): 40 mg via SUBCUTANEOUS
  Filled 2021-08-05 (×3): qty 0.4

## 2021-08-05 MED ORDER — LITHIUM CARBONATE ER 450 MG PO TBCR
450.0000 mg | EXTENDED_RELEASE_TABLET | Freq: Every day | ORAL | Status: DC
  Administered 2021-08-05 – 2021-08-06 (×3): 450 mg via ORAL
  Filled 2021-08-05 (×4): qty 1

## 2021-08-05 MED ORDER — MELATONIN 5 MG PO TABS
10.0000 mg | ORAL_TABLET | Freq: Every day | ORAL | Status: DC
  Administered 2021-08-05 – 2021-08-06 (×3): 10 mg via ORAL
  Filled 2021-08-05 (×3): qty 2

## 2021-08-05 MED ORDER — OLANZAPINE 2.5 MG PO TABS
2.5000 mg | ORAL_TABLET | Freq: Every day | ORAL | Status: DC
  Administered 2021-08-05 – 2021-08-06 (×3): 2.5 mg via ORAL
  Filled 2021-08-05 (×4): qty 1

## 2021-08-05 MED ORDER — METHYLPHENIDATE HCL 10 MG PO TABS
10.0000 mg | ORAL_TABLET | Freq: Every morning | ORAL | Status: DC
  Administered 2021-08-05 – 2021-08-07 (×3): 10 mg via ORAL
  Filled 2021-08-05 (×3): qty 1

## 2021-08-05 MED ORDER — SODIUM CHLORIDE 0.9 % IV SOLN: INTRAVENOUS | Status: AC

## 2021-08-05 NOTE — Plan of Care (Signed)

## 2021-08-05 NOTE — Plan of Care (Signed)

## 2021-08-05 NOTE — Progress Notes (Signed)
Pt. Has not voided by himself since he is here. Night shift performed Straight cath around 3am. Bladder scanned . Patient is not in distress. Informed provider. Advised to continue the fluids and monitor the pt. Will continue to monitor the patient.  ?

## 2021-08-05 NOTE — Progress Notes (Signed)
?   08/04/21 2316  ?Assess: MEWS Score  ?Temp (!) 103.2 ?F (39.6 ?C)  ?BP 107/69  ?Pulse Rate 99  ?Resp (!) 27  ?SpO2 93 %  ?O2 Device Nasal Cannula  ?O2 Flow Rate (L/min) 2 L/min  ?Assess: MEWS Score  ?MEWS Temp 2  ?MEWS Systolic 0  ?MEWS Pulse 0  ?MEWS RR 2  ?MEWS LOC 0  ?MEWS Score 4  ?MEWS Score Color Red  ?Assess: if the MEWS score is Yellow or Red  ?Were vital signs taken at a resting state? Yes  ?Focused Assessment No change from prior assessment  ?Does the patient meet 2 or more of the SIRS criteria? Yes  ?Does the patient have a confirmed or suspected source of infection? Yes  ?Provider and Rapid Response Notified? Yes  ?MEWS guidelines implemented *See Row Information* Yes  ?Treat  ?MEWS Interventions Administered scheduled meds/treatments;Administered prn meds/treatments  ?Pain Scale 0-10  ?Pain Score 0  ?Complains of Shortness of breath  ?Interventions Cold pack;Reposition;Relaxation  ?Patients response to intervention Decreased  ?Take Vital Signs  ?Increase Vital Sign Frequency  Red: Q 1hr X 4 then Q 4hr X 4, if remains red, continue Q 4hrs  ?Escalate  ?MEWS: Escalate Red: discuss with charge nurse/RN and provider, consider discussing with RRT  ?Notify: Charge Nurse/RN  ?Name of Charge Nurse/RN Notified Thayer Ohm  ?Date Charge Nurse/RN Notified 08/04/21  ?Time Charge Nurse/RN Notified 2316  ?Notify: Provider  ?Provider Name/Title Dr. Toniann Fail  ?Date Provider Notified 08/04/21  ?Time Provider Notified 2320  ?Method of Notification Page  ?Notification Reason Change in status  ?Provider response En route  ?Date of Provider Response 08/04/21  ?Time of Provider Response 2324  ?Document  ?Patient Outcome Stabilized after interventions  ?Progress note created (see row info) Yes  ?Assess: SIRS CRITERIA  ?SIRS Temperature  1  ?SIRS Pulse 1  ?SIRS Respirations  1  ?SIRS WBC 0  ?SIRS Score Sum  3  ? ? ?

## 2021-08-05 NOTE — H&P (Signed)
?History and Physical  ? ? ?Alexander Price JKK:[id number] DOB: 26-Jun-1945 DOA: 08/04/2021 ? ?PCP: Philip Aspen, Limmie Patricia, MD  ?Patient coming from: Independent living facility. ? ?History obtained from patient's daughter. ? ?Chief Complaint: Confusion and cough. ? ?HPI: Alexander Price is a 76 y.o. male with known history of severe depression, hyperlipidemia was found to be confused at his living facility.  As per the report patient was found on the floor yesterday and had to be helped onto his bed.  Patient's daughter states that patient was normal about 48 hours ago having dinner with his friends.  Yesterday per the report patient had a fall and was on the floor.  Confused had urinated all over.  Patient's daughter usually takes care of his pillbox and patient has not taken his medications including lithium for last 2 days.  Patient has had prior episodes of suicidal thoughts and overdosing but this time patient has not taken any of his medicines.  He has been having cough which was new for the patient. ? ?Last month patient had new medication Ritalin added to his regimen by his psychiatrist. ? ?ED Course: In the ER patient appears confused oriented to his name moving all extremities.  Generally weak CK levels were elevated at 1100 AST was mildly elevated at 58 COVID test was positive patient was febrile with temperature 103 ?F chest x-ray did not show any infiltrates.  Patient on exam is coughing nonproductive.  Patient was started on fluids admitted for further work-up for COVID infection with encephalopathy. ? ?Review of Systems: As per HPI, rest all negative. ? ? ?Past Medical History:  ?Diagnosis Date  ? Depression   ? sees Ellis Savage NP at [facility]   ? Hyperlipidemia   ? ? ?Past Surgical History:  ?Procedure Laterality Date  ? TONSILLECTOMY    ? ? ? reports that he has never smoked. He has never used smokeless tobacco. He reports that he does not drink alcohol and does not use drugs. ? ?No  Known Allergies ? ?Family History  ?Family history unknown: Yes  ? ? ?Prior to Admission medications   ?Medication Sig Start Date End Date Taking? Authorizing Provider  ?donepezil (ARICEPT) 5 MG tablet Take 10 mg by mouth at bedtime.   Yes [provider]  ?DULoxetine (CYMBALTA) 60 MG capsule Take 60 mg by mouth in the morning.   Yes [provider]  ?lithium carbonate (ESKALITH) 450 MG CR tablet Take 450 mg by mouth at bedtime. A total dose of 750mg  nightly   Yes [provider]  ?Melatonin 5 MG CHEW Chew 10 mg by mouth at bedtime.   Yes [provider]  ?methylphenidate (RITALIN) 10 MG tablet Take 10 mg by mouth in the morning.   Yes [provider]  ?mirtazapine (REMERON) 30 MG tablet Take 15 mg by mouth at bedtime.   Yes [provider]  ?OLANZapine (ZYPREXA) 5 MG tablet Take 2.5 mg by mouth at bedtime.   Yes [provider]  ?simvastatin (ZOCOR) 40 MG tablet TAKE 1 TABLET BY MOUTH EVERYDAY AT BEDTIME ?Patient taking differently: Take 40 mg by mouth at bedtime. 01/11/21  Yes 01/13/21, Philip Aspen, MD  ?lithium carbonate (LITHOBID) 300 MG CR tablet Take 300 mg by mouth at bedtime. A total dose of 750mg  nightly    [provider]  ? ? ?Physical Exam: ?Constitutional: Moderately built and nourished. ?Vitals:  ? 08/04/21 2237 08/04/21 2316 08/04/21 2343 08/05/21 0041  ?BP:  107/69  111/65  ?Pulse: 99 99  86  ?Resp: (!) 25 (!) 27 (!) 27 (!) 22  ?Temp: (!) 103.3 ?F (39.6 ?C) (!) 103.2 ?F (39.6 ?C) (!) 102.1 ?F (38.9 ?C) 99.3 ?F (37.4 ?C)  ?TempSrc: Oral Oral Oral Oral  ?SpO2: 95% 93% 93% 93%  ?Weight:      ?Height:  6' (1.829 m)    ? ?Eyes: Anicteric no pallor. ?ENMT: No discharge from the ears eyes nose and mouth. ?Neck: No mass felt.  No neck rigidity. ?Respiratory: No rhonchi or crepitations. ?Cardiovascular: S1-S2 heard. ?Abdomen: Soft nontender bowel sound present. ?Musculoskeletal: No edema. ?Skin: No rash. ?Neurologic: Alert awake  oriented to his name ?Extremity appears confused pinpoint pupils bilaterally. ?Psychiatric: Appears confused. ? ? ?Labs on Admission: I have personally reviewed following labs and imaging studies ? ?CBC: ?Recent Labs  ?Lab 08/04/21 ?1403  ?WBC 7.2  ?HGB 15.0  ?HCT 47.1  ?MCV 96.7  ?PLT 170  ? ?Basic Metabolic Panel: ?Recent Labs  ?Lab 08/04/21 ?1403  ?NA 140  ?K 3.8  ?CL 101  ?CO2 28  ?GLUCOSE 96  ?BUN 16  ?CREATININE 1.23  ?CALCIUM 9.2  ? ?GFR: ?Estimated Creatinine Clearance: 56.1 mL/min (by C-G formula based on SCr of 1.23 mg/dL). ?Liver Function Tests: ?Recent Labs  ?Lab 08/04/21 ?1403  ?AST 58*  ?ALT 23  ?ALKPHOS 94  ?BILITOT 0.5  ?PROT 7.3  ?ALBUMIN 4.1  ? ?No results for input(s): LIPASE, AMYLASE in the last 168 hours. ?No results for input(s): AMMONIA in the last 168 hours. ?Coagulation Profile: ?No results for input(s): INR, PROTIME in the last 168 hours. ?Cardiac Enzymes: ?Recent Labs  ?Lab 08/04/21 ?1403  ?CKTOTAL 1,186*  ? ?BNP (last 3 results) ?No results for input(s): PROBNP in the last 8760 hours. ?HbA1C: ?No results for input(s): HGBA1C in the last 72 hours. ?CBG: ?Recent Labs  ?Lab 08/04/21 ?1411  ?GLUCAP 92  ? ?Lipid Profile: ?No results for input(s): CHOL, HDL, LDLCALC, TRIG, CHOLHDL, LDLDIRECT in the last 72 hours. ?Thyroid Function Tests: ?No results for input(s): TSH, T4TOTAL, FREET4, T3FREE, THYROIDAB in the last 72 hours. ?Anemia Panel: ?No results for input(s): VITAMINB12, FOLATE, FERRITIN, TIBC, IRON, RETICCTPCT in the last 72 hours. ?Urine analysis: ?   ?Component Value Date/Time  ? COLORURINE YELLOW 08/04/2021 1640  ? APPEARANCEUR CLEAR 08/04/2021 1640  ? LABSPEC 1.026 08/04/2021 1640  ? PHURINE 6.0 08/04/2021 1640  ? GLUCOSEU NEGATIVE 08/04/2021 1640  ? HGBUR TRACE (A) 08/04/2021 1640  ? BILIRUBINUR NEGATIVE 08/04/2021 1640  ? KETONESUR 40 (A) 08/04/2021 1640  ? PROTEINUR 100 (A) 08/04/2021 1640  ? NITRITE NEGATIVE 08/04/2021 1640  ? LEUKOCYTESUR NEGATIVE 08/04/2021 1640  ? ?Sepsis  Labs: ?@LABRCNTIP (procalcitonin:4,lacticidven:4) ?) ?Recent Results (from the past 240 hour(s))  ?Resp Panel by RT-PCR (Flu A&B, Covid) Nasopharyngeal Swab     Status: Abnormal  ? Collection Time: 08/04/21  2:03 PM  ? Specimen: Nasopharyngeal Swab; Nasopharyngeal(NP) swabs in vial transport medium  ?Result Value Ref Range Status  ? SARS Coronavirus 2 by RT PCR POSITIVE (A) NEGATIVE Final  ?  Comment: (NOTE) ?SARS-CoV-2 target nucleic acids are DETECTED. ? ?The SARS-CoV-2 RNA is generally detectable in upper respiratory ?specimens during the acute phase of infection. Positive results are ?indicative of the presence of the identified virus, but do not rule ?out bacterial infection or co-infection with other pathogens not ?detected by the test. Clinical correlation with patient history and ?other diagnostic information is necessary to determine patient ?infection status. The expected result is  Negative. ? ?Fact Sheet for Patients: ?BloggerCourse.com ? ?Fact Sheet for Healthcare Providers: ?SeriousBroker.it ? ?This test is not yet approved or cleared by the [place] FDA and  ?has been authorized for detection and/or diagnosis of SARS-CoV-2 by ?FDA under an Emergency Use Authorization (EUA).  This EUA will ?remain in effect (meaning this test can be used) for the duration of  ?the COVID-19 declaration under Section 564(b)(1) of the [place], 21 ?U.S.C. section 360bbb-3(b)(1), unless the authorization is ?terminated or revoked sooner. ? ?  ? Influenza A by PCR NEGATIVE NEGATIVE Final  ? Influenza B by PCR NEGATIVE NEGATIVE Final  ?  Comment: (NOTE) ?The Xpert Xpress SARS-CoV-2/FLU/RSV plus assay is intended as an aid ?in the diagnosis of influenza from Nasopharyngeal swab specimens and ?should not be used as a sole basis for treatment. Nasal washings and ?aspirates are unacceptable for Xpert Xpress SARS-CoV-2/FLU/RSV ?testing. ? ?Fact Sheet for  Patients: ?BloggerCourse.com ? ?Fact Sheet for Healthcare Providers: ?SeriousBroker.it ? ?This test is not yet approved or cleared by the [place] and ?has been authorized for [employer]

## 2021-08-05 NOTE — Progress Notes (Signed)
And- ?[facility] Progress Note ? ?Patient: Alexander Price     ?WEX:[id number]  ?DOA: 08/04/2021   ?PCP: Philip Aspen, Limmie Patricia, MD  ? ?  ?  ?Brief hospital course: ?This is a 76 year old male who lives in an independent living facility and has a history of depression or lipidemia.  The patient was found on the floor yesterday after a fall.  He was noted to be confused and had lost control of his bladder.  According to his daughter who brought him to the hospital, the patient had not taken his medication for 2 days including his lithium.  In the ED he was noted to have a cough, temperature of 103, CK11 100, AST 58 and a positive COVID PCR. ?The patient was started on molnupiravir and admitted to the hospital. ? ?Subjective:  ?Feels thirsty.  Has a cough.  No other complaints ?Assessment and Plan: ?Principal Problem: ?  COVID-19 virus infection ?-Symptoms include a cough ?- No hypoxia and no pulmonary infiltrates yet ?- Appears to be tolerating molnupiravir ? ?Active Problems: ?Encephalopathy-acute ?- Possibly due to lack of medication for the past 2 days ?- The patient appears alert and oriented today ?- MRI brain without contrast has been ordered but yet to be performed ? ? Non-traumatic rhabdomyolysis ?-Continue IV fluids ? ?Poor urine output/dehydration ?- Continue IV fluids ? ?Severe depression ?- Continue Zyprexa, lithium, Cymbalta, Ritalin ? ?Hard of hearing ? ?  ?DVT prophylaxis: Lovenox ?  Code Status: Partial Code  ?Consultants: None ?Level of Care: Level of care: Med-Surg ?Disposition Plan:  ?Status is: Inpatient ?Remains inpatient appropriate because: Severe weakness and confusion, COVID-19 ? ?Objective: ?  ?Vitals:  ? 08/05/21 0343 08/05/21 0743 08/05/21 1037 08/05/21 1158  ?BP: 106/64 120/69  102/70  ?Pulse: 78 82  78  ?Resp: 16 16  (!) 22  ?Temp: 98 ?F (36.7 ?C) 98.7 ?F (37.1 ?C)  98.7 ?F (37.1 ?C)  ?TempSrc: Oral Oral  Oral  ?SpO2: 95% 95% 92% 91%  ?Weight:      ?Height:      ? ?Filed  Weights  ? 08/04/21 1309 08/04/21 2100  ?Weight: 88.3 kg 79.3 kg  ? ?Exam: ?General exam: Appears comfortable  ?HEENT: PERRLA, oral mucosa moist, no sclera icterus or thrush-  ?Respiratory system: Clear to auscultation. Respiratory effort normal. ?Cardiovascular system: S1 & S2 heard, regular rate and rhythm ?Gastrointestinal system: Abdomen soft, non-tender, nondistended. Normal bowel sounds   ?Central nervous system: Alert and oriented. No focal neurological deficits. ?Extremities: No cyanosis, clubbing or edema ?Skin: No rashes or ulcers ?Psychiatry:  Mood & affect appropriate.   ? ?Imaging and lab data was personally reviewed ? ? ? CBC: ?Recent Labs  ?Lab 08/04/21 ?1403  ?WBC 7.2  ?HGB 15.0  ?HCT 47.1  ?MCV 96.7  ?PLT 170  ? ?Basic Metabolic Panel: ?Recent Labs  ?Lab 08/04/21 ?1403  ?NA 140  ?K 3.8  ?CL 101  ?CO2 28  ?GLUCOSE 96  ?BUN 16  ?CREATININE 1.23  ?CALCIUM 9.2  ? ?GFR: ?Estimated Creatinine Clearance: 56.1 mL/min (by C-G formula based on SCr of 1.23 mg/dL). ? ?Scheduled Meds: ? donepezil  10 mg Oral QHS  ? DULoxetine  60 mg Oral Daily  ? enoxaparin (LOVENOX) injection  40 mg Subcutaneous Q24H  ? lithium carbonate  450 mg Oral QHS  ? lithium carbonate  300 mg Oral QHS  ? melatonin  10 mg Oral QHS  ? methylphenidate  10 mg Oral q AM  ? mirtazapine  15 mg Oral QHS  ? molnupiravir EUA  4 capsule Oral BID  ? OLANZapine  2.5 mg Oral QHS  ? ?Continuous Infusions: ? sodium chloride 125 mL/hr at 08/05/21 1036  ? ? ? LOS: 1 day  ? ?Author: ?Calvert Cantor  ?08/05/2021 1:46 PM ?   ?

## 2021-08-06 DIAGNOSIS — F333 Major depressive disorder, recurrent, severe with psychotic symptoms: Secondary | ICD-10-CM

## 2021-08-06 DIAGNOSIS — M6282 Rhabdomyolysis: Secondary | ICD-10-CM | POA: Diagnosis not present

## 2021-08-06 DIAGNOSIS — U071 COVID-19: Secondary | ICD-10-CM | POA: Diagnosis not present

## 2021-08-06 MED ORDER — ADULT MULTIVITAMIN W/MINERALS CH
1.0000 | ORAL_TABLET | Freq: Every day | ORAL | Status: DC
  Administered 2021-08-07: 1 via ORAL
  Filled 2021-08-06: qty 1

## 2021-08-06 MED ORDER — ENSURE ENLIVE PO LIQD
237.0000 mL | Freq: Two times a day (BID) | ORAL | Status: DC
  Administered 2021-08-07: 237 mL via ORAL

## 2021-08-06 NOTE — Progress Notes (Signed)
And- ?[facility] Progress Note ? ?Patient: Alexander Price     ?HAL:[id number]  ?DOA: 08/04/2021   ?PCP: Philip Aspen, Limmie Patricia, MD  ? ?  ?  ?Brief hospital course: ?This is a 76 year old male who lives in an independent living facility and has a history of depression or lipidemia.  The patient was found on the floor yesterday after a fall.  He was noted to be confused and had lost control of his bladder.  According to his daughter who brought him to the hospital, the patient had not taken his medication for 2 days including his lithium.  In the ED he was noted to have a cough, temperature of 103, CK11 100, AST 58 and a positive COVID PCR. ?The patient was started on molnupiravir and admitted to the hospital. ? ?Subjective:  ?He feels well today. Has a cough and but no dyspnea at rest.  ?Assessment and Plan: ?Principal Problem: ?  COVID-19 virus infection ?-Symptoms include a cough ?- No hypoxia and no pulmonary infiltrates yet ?- Appears to be tolerating Molnupiravir and is recuperating ?- need to ambulate  ? ?Active Problems: ?Encephalopathy-acute ?- Possibly due to lack of medication for the past 2 days ?- The patient appears alert and oriented and is not having any trouble with mentation today ?- MRI brain without contrast has been ordered and is negative for acute abnormalities ? ?Urinary retention ?- needed I and O cath overnight x 3 ?- was able to void today ? ? Non-traumatic rhabdomyolysis ?-will dc IVF today ? ?Poor urine output/dehydration ?-resolving- drinking liquids now ? ?Severe depression ?- Continue Zyprexa, lithium, Cymbalta, Ritalin ? ?Hard of hearing ? ?  ?DVT prophylaxis: Lovenox ?  Code Status: Partial Code  ?Consultants: None ?Level of Care: Level of care: Med-Surg ?Disposition Plan:  ?Status is: Inpatient ?Remains inpatient appropriate because: Severe weakness and confusion, COVID-19 ? ?Objective: ?  ?Vitals:  ? 08/05/21 1158 08/05/21 1810 08/05/21 2204 08/06/21 0610  ?BP: 102/70  110/64 111/63 97/64  ?Pulse: 78 78 78 60  ?Resp: (!) 22 16 17 17   ?Temp: 98.7 ?F (37.1 ?C) 98.8 ?F (37.1 ?C) 98.8 ?F (37.1 ?C) 97.9 ?F (36.6 ?C)  ?TempSrc: Oral Oral Oral Oral  ?SpO2: 91% 93% 91% 96%  ?Weight:      ?Height:      ? ?Filed Weights  ? 08/04/21 1309 08/04/21 2100  ?Weight: 88.3 kg 79.3 kg  ? ?Exam: ?General exam: Appears comfortable  ?HEENT: PERRLA, oral mucosa moist, no sclera icterus or thrush ?Respiratory system: Clear to auscultation. Respiratory effort normal. ?Cardiovascular system: S1 & S2 heard, regular rate and rhythm ?Gastrointestinal system: Abdomen soft, non-tender, nondistended. Normal bowel sounds   ?Central nervous system: Alert and oriented. No focal neurological deficits. ?Extremities: No cyanosis, clubbing or edema ?Skin: No rashes or ulcers ?Psychiatry:  Mood & affect appropriate.   ? ?Imaging and lab data was personally reviewed ? ? ? CBC: ?Recent Labs  ?Lab 08/04/21 ?1403  ?WBC 7.2  ?HGB 15.0  ?HCT 47.1  ?MCV 96.7  ?PLT 170  ? ? ?Basic Metabolic Panel: ?Recent Labs  ?Lab 08/04/21 ?1403  ?NA 140  ?K 3.8  ?CL 101  ?CO2 28  ?GLUCOSE 96  ?BUN 16  ?CREATININE 1.23  ?CALCIUM 9.2  ? ? ?GFR: ?Estimated Creatinine Clearance: 56.1 mL/min (by C-G formula based on SCr of 1.23 mg/dL). ? ?Scheduled Meds: ? donepezil  10 mg Oral QHS  ? DULoxetine  60 mg Oral Daily  ? enoxaparin (LOVENOX)  injection  40 mg Subcutaneous Q24H  ? lithium carbonate  450 mg Oral QHS  ? lithium carbonate  300 mg Oral QHS  ? melatonin  10 mg Oral QHS  ? methylphenidate  10 mg Oral q AM  ? mirtazapine  15 mg Oral QHS  ? molnupiravir EUA  4 capsule Oral BID  ? OLANZapine  2.5 mg Oral QHS  ? ?Continuous Infusions: ? ? ? ? LOS: 2 days  ? ?Author: ?Calvert Cantor  ?08/06/2021 1:20 PM ?   ?

## 2021-08-06 NOTE — Progress Notes (Signed)
Initial Nutrition Assessment ? ?DOCUMENTATION CODES:  ? ?Not applicable ? ?INTERVENTION:  ?- Ensure Enlive po BID, each supplement provides 350 kcal and 20 grams of protein. ?- MVI with minerals daily ? ?NUTRITION DIAGNOSIS:  ? ?Increased nutrient needs related to acute illness as evidenced by estimated needs. ? ? ?GOAL:  ? ?Patient will meet greater than or equal to 90% of their needs ? ? ?MONITOR:  ? ?PO intake, Supplement acceptance, Diet advancement, Labs ? ?REASON FOR ASSESSMENT:  ? ?Malnutrition Screening Tool ?  ? ?ASSESSMENT:  ? ?Pt admitted from ILF after a fall with confusion. Found to be COVID+ on admission. PMH significant for depression and hyperlipidemia. ? ?Unsuccessful attempt to reach pt via phone call to room. Noted 2 breakfast meal completions of 100%.  ? ?Pt would likely benefit from addition of nutrition supplement. Will add Ensure and adjust as appropriate throughout admission.  ? ?Reviewed weight history. There's limited documentation of weights within the last year to assess weight trends. Weight as of 5/13 noted to be 79.3 kg.  ? ?Medications: melatonin, ritalin, remeron ? ?Labs reviewed  ? ?NUTRITION - FOCUSED PHYSICAL EXAM: ?RD working remotely. Deferred to follow up.  ? ?Diet Order:   ?Diet Order   ? ?       ?  Diet regular Room service appropriate? Yes; Fluid consistency: Thin  Diet effective now       ?  ? ?  ?  ? ?  ? ? ?EDUCATION NEEDS:  ? ?No education needs have been identified at this time ? ?Skin:  Skin Assessment: Reviewed RN Assessment ? ?Last BM:  5/14 ? ?Height:  ? ?Ht Readings from Last 1 Encounters:  ?08/04/21 6' (1.829 m)  ? ? ?Weight:  ? ?Wt Readings from Last 1 Encounters:  ?08/04/21 79.3 kg  ? ? ?BMI:  Body mass index is 23.73 kg/m?. ? ?Estimated Nutritional Needs:  ? ?Kcal:  2200-2400 ? ?Protein:  110-125g ? ?Fluid:  >/=2L ? ?Drusilla Kanner, RDN, LDN ?Clinical Nutrition ?

## 2021-08-06 NOTE — TOC Initial Note (Signed)
Transition of Care (TOC) - Initial/Assessment Note  ? ? ?Patient Details  ?Name: Mcguire Gasparyan Krammes ?MRN: [id number] ?Date of Birth: 11-Nov-1945 ? ?Transition of Care (TOC) CM/SW Contact:    ?Justice Aguirre, LCSW ?Phone Number: ?08/06/2021, 2:21 PM ? ?Clinical Narrative:                 ?Met with pt's daughter, Joycelyn Schmid, to review dc plans. She confirms that pt has been a resident in an [place] apt at [facility] x 3 yrs but notes his mobility has been declining over the past few months and he has been reluctant to use his walker.  She notes that there are only IL apts at [facility] but he was received some PT visits there via [employer].  Contacted the onsite Freight forwarder at [facility] who notes they do not have any medical personnel on site and says "this is strictly independent level".  At this point, given pt was showing some functional decline prior to this admit, would be beneficial to have PT evaluate if possible.  Daughter, also, looking into arranging for some private duty help when/ if he returns to [place]. ? ?Expected Discharge Plan:  (return to IL apt vs ALF vs SNF) ?Barriers to Discharge: Continued Medical Work up ? ? ?Patient Goals and CMS Choice ?Patient states their goals for this hospitalization and ongoing recovery are:: pt hopeful to return to his apt ?  ?  ? ?Expected Discharge Plan and Services ?Expected Discharge Plan:  (return to [place] apt vs ALF vs SNF) ?  ?  ?  ?Living arrangements for the past 2 months: [facility] x 3 yrs) ?                ?  ?  ?  ?  ?  ?  ?  ?  ?  ?  ? ?Prior Living Arrangements/Services ?Living arrangements for the past 2 months: [facility] x [facility] yrs) ?Lives with:: Self ?Patient language and need for interpreter reviewed:: Yes ?Do you feel safe going back to the place where you live?: Yes      ?Need for Family Participation in Patient Care: No (Comment) ?Care giver support system in place?:  Yes (comment) ?  ?Criminal Activity/Legal Involvement Pertinent to Current Situation/Hospitalization: No - Comment as needed ? ?Activities of Daily Living ?Home Assistive Devices/Equipment: None ?ADL Screening (condition at time of admission) ?Patient's cognitive ability adequate to safely complete daily activities?: No ?Is the patient deaf or have difficulty hearing?: Yes ?Does the patient have difficulty seeing, even when wearing glasses/contacts?: Yes ?Does the patient have difficulty concentrating, remembering, or making decisions?: Yes ?Patient able to express need for assistance with ADLs?: No ?Does the patient have difficulty dressing or bathing?: Yes ?Independently performs ADLs?: No ?Communication: Independent ?Dressing (OT): Dependent ?Is this a change from baseline?: Change from baseline, expected to last >3 days ?Grooming: Dependent ?Is this a change from baseline?: Change from baseline, expected to last >3 days ?Feeding: Dependent, Needs assistance ?Is this a change from baseline?: Change from baseline, expected to last >3 days ?Bathing: Dependent ?Is this a change from baseline?: Change from baseline, expected to last >3 days ?Toileting: Dependent ?Is this a change from baseline?: Change from baseline, expected to last >3days ?In/Out Bed: Dependent ?Is this a change from baseline?: Change from baseline, expected to last >3 days ?Walks in Home: Independent ?Does the patient have difficulty walking or climbing stairs?: Yes ?Weakness of  Legs: Both ?Weakness of Arms/Hands: Both ? ?Permission Sought/Granted ?Permission sought to share information with : Family Supports ?Permission granted to share information with : Yes, Verbal Permission Granted ? Share Information with NAME: Wilburn Mylar ?   ? Permission granted to share info w Relationship: daughter ? Permission granted to share info w Contact Information: [phone] ? ?Emotional Assessment ?  ?Attitude/Demeanor/Rapport: Gracious ?Affect (typically  observed): Accepting ?Orientation: : Oriented to Self, Oriented to Place, Oriented to  Time, Oriented to Situation ?Alcohol / Substance Use: Not Applicable ?Psych Involvement: No (comment) ? ?Admission diagnosis:  Hypoxia [R09.02] ?Non-traumatic rhabdomyolysis [M62.82] ?COVID-19 virus infection [U07.1] ?COVID-19 [U07.1] ?Patient Active Problem List  ? Diagnosis Date Noted  ? COVID-19 08/05/2021  ? Non-traumatic rhabdomyolysis   ? COVID-19 virus infection 08/04/2021  ? Hyperlipidemia   ? Depression   ? ?PCP:  Isaac Bliss, Rayford Halsted, MD ?Pharmacy:   ?[id number] [place] [email or web address]?Phone: [phone]: [phone]? ? ? ? ?Social Determinants of Health (SDOH) Interventions ?  ? ?Readmission Risk Interventions ?   ? View : No data to display.  ?  ?  ?  ? ? ? ?

## 2021-08-07 DIAGNOSIS — F333 Major depressive disorder, recurrent, severe with psychotic symptoms: Secondary | ICD-10-CM | POA: Diagnosis not present

## 2021-08-07 DIAGNOSIS — U071 COVID-19: Secondary | ICD-10-CM | POA: Diagnosis not present

## 2021-08-07 DIAGNOSIS — E785 Hyperlipidemia, unspecified: Secondary | ICD-10-CM

## 2021-08-07 DIAGNOSIS — M6282 Rhabdomyolysis: Secondary | ICD-10-CM | POA: Diagnosis not present

## 2021-08-07 MED ORDER — MOLNUPIRAVIR EUA 200MG CAPSULE
4.0000 | ORAL_CAPSULE | Freq: Two times a day (BID) | ORAL | 0 refills | Status: AC
Start: 2021-08-07 — End: 2021-08-12

## 2021-08-07 MED ORDER — ADULT MULTIVITAMIN W/MINERALS CH: 1.0000 | ORAL_TABLET | Freq: Every day | ORAL | Status: DC

## 2021-08-07 MED ORDER — ENSURE ENLIVE PO LIQD
237.0000 mL | Freq: Two times a day (BID) | ORAL | 12 refills | Status: DC
Start: 2021-08-07 — End: 2021-12-18

## 2021-08-07 NOTE — TOC Transition Note (Signed)
Transition of Care (TOC) - CM/SW Discharge Note ? ? ?Patient Details  ?Name: Alexander Price ?MRN: [id number] ?Date of Birth: Jul 30, 1945 ? ?Transition of Care (TOC) CM/SW Contact:  ?Daena Alper, LCSW ?Phone Number: ?08/07/2021, 3:37 PM ? ? ?Clinical Narrative:    ?Have spoken with PT/RN/MD and daughter and pt is cleared to return to [place] apt at [facility] and planning dc today.  I have contacted Benjaman Kindler with [facility] - agency providing therapy services at [facility] - and orders to be sent to resume PT services and add OT to care plan.  Pt has all needed DME.  ? ? ?Final next level of care: Home w Home Health Services ?Barriers to Discharge: Barriers Resolved ? ? ?Patient Goals and CMS Choice ?Patient states their goals for this hospitalization and ongoing recovery are:: pt hopeful to return to his apt ?  ?  ? ?Discharge Placement ?  ?           ?  ?  ?  ?  ? ?Discharge Plan and Services ?  ?  ?           ?DME Arranged: N/A ?DME Agency: NA ?  ?  ?  ?HH Arranged: PT, OT ?HH Agency: Other - See comment International aid/development worker) ?Date HH Agency Contacted: 08/07/21 ?Time HH Agency Contacted: 1537 ?Representative spoke with at [facility] Agency: Rene Kocher ? ?Social Determinants of Health (SDOH) Interventions ?  ? ? ?Readmission Risk Interventions ?   ? View : No data to display.  ?  ?  ?  ? ? ? ? ? ?

## 2021-08-07 NOTE — Discharge Summary (Signed)
Physician Discharge Summary  ?Alexander Price YIF:[id number] DOB: July 13, 1945 DOA: 08/04/2021 ? ?PCP: Alexander Aspen, Limmie Patricia, MD ? ?Admit date: 08/04/2021 ?Discharge date: 08/07/2021 ?Discharging to: independent livign ?Recommendations for Outpatient Follow-up:  ?none ? ?Consults:  ?none ?Procedures:  ?none ? ? ?Discharge Diagnoses:  ? Principal Problem: ?  COVID-19 virus infection ?Active Problems: ?  Non-traumatic rhabdomyolysis ?  Hyperlipidemia ?  Depression ? ? ?Brief hospital course: ?This is a 76 year old male who lives in an independent living facility and has a history of depression or lipidemia.  The patient was found on the floor yesterday after a fall.  He was noted to be confused and had lost control of his bladder.  According to his daughter who brought him to the hospital, the patient had not taken his medication for 2 days including his lithium.  In the ED he was noted to have a cough, temperature of 103, CK11 100, AST 58 and a positive COVID PCR. ?The patient was started on molnupiravir and admitted to the hospital. ? ?Subjective:  ?Mild cough today. ?  ?Assessment and Plan: ?Principal Problem: ?  COVID-19 virus infection ?-Symptoms include a cough which is better ?- No hypoxia and no pulmonary infiltrates  ?- Appears to be tolerating Molnupiravir  ?- awaiting PT eval to decide if he can return to independent living ?  ? ?Active Problems: ?Encephalopathy-acute ?- Possibly due to lack of medication for the past 2 days ?- The patient appears alert and oriented and is not having any trouble with mentation today ?- MRI brain without contrast has been is negative for acute abnormalities ? ?Urinary retention ?- needed I and O cath overnight x 3 on night of 5/14 ?- was able to void subsequently ? ? Non-traumatic rhabdomyolysis ?-stopped IVF ? ?Poor urine output/dehydration ?-resolving- drinking liquids now ? ?Severe depression ?- Continue Zyprexa, lithium, Cymbalta, Ritalin ? ?Hard of hearing ? ? ?  ? ? ?   ? ?Discharge Instructions ? ?Discharge Instructions   ? ? Diet - low sodium heart healthy   Complete by: As directed ?  ? Increase activity slowly   Complete by: As directed ?  ? ?  ? ?Allergies as of 08/07/2021   ?No Known Allergies ?  ? ?  ?Medication List  ?  ? ?TAKE these medications   ? ?donepezil 5 MG tablet ?Commonly known as: ARICEPT ?Take 10 mg by mouth at bedtime. ?  ?DULoxetine 60 MG capsule ?Commonly known as: CYMBALTA ?Take 60 mg by mouth in the morning. ?  ?feeding supplement Liqd ?Take 237 mLs by mouth 2 (two) times daily between meals. ?  ?lithium carbonate 300 MG CR tablet ?Commonly known as: LITHOBID ?Take 300 mg by mouth at bedtime. A total dose of 750mg  nightly ?  ?lithium carbonate 450 MG CR tablet ?Commonly known as: ESKALITH ?Take 450 mg by mouth at bedtime. A total dose of 750mg  nightly ?  ?Melatonin 5 MG Chew ?Chew 10 mg by mouth at bedtime. ?  ?methylphenidate 10 MG tablet ?Commonly known as: RITALIN ?Take 10 mg by mouth in the morning. ?  ?mirtazapine 30 MG tablet ?Commonly known as: REMERON ?Take 15 mg by mouth at bedtime. ?  ?molnupiravir EUA 200 mg Caps capsule ?Commonly known as: LAGEVRIO ?Take 4 capsules (800 mg total) by mouth 2 (two) times daily for 5 days. ?  ?multivitamin with minerals Tabs tablet ?Take 1 tablet by mouth daily. ?Start taking on: Aug 08, 2021 ?  ?OLANZapine 5 MG tablet ?Commonly  known as: ZYPREXA ?Take 2.5 mg by mouth at bedtime. ?  ?simvastatin 40 MG tablet ?Commonly known as: ZOCOR ?TAKE 1 TABLET BY MOUTH EVERYDAY AT BEDTIME ?What changed: See the new instructions. ?  ? ?  ? ? ?  ?  ?The results of significant diagnostics from this hospitalization (including imaging, microbiology, ancillary and laboratory) are listed below for reference.   ? ?DG Pelvis 1-2 Views ? ?Result Date: 08/05/2021 ?CLINICAL DATA:  Fall EXAM: PELVIS - 1-2 VIEW COMPARISON:  None Available. FINDINGS: There is no evidence of displaced pelvic fracture or diastasis. No pelvic bone lesions are  seen. Severe bilateral femoroacetabular arthrosis. Nonobstructive pattern of overlying bowel gas. IMPRESSION: No displaced fracture of the pelvis or bilateral proximal femurs in single frontal view. Please note that plain radiographs are significantly insensitive for hip and pelvic fracture. Recommend CT or MRI to more sensitively evaluate if there is high clinical concern for fracture. Electronically Signed   By: Jearld Lesch M.D.   On: 08/05/2021 09:07  ? ?CT Head Wo Contrast ? ?Result Date: 08/04/2021 ?CLINICAL DATA:  Altered mental status, fall EXAM: CT HEAD WITHOUT CONTRAST TECHNIQUE: Contiguous axial images were obtained from the base of the skull through the vertex without intravenous contrast. RADIATION DOSE REDUCTION: This exam was performed according to the departmental dose-optimization program which includes automated exposure control, adjustment of the mA and/or kV according to patient size and/or use of iterative reconstruction technique. COMPARISON:  None Available. FINDINGS: Brain: No acute intracranial findings are seen in noncontrast CT brain. There are no signs of bleeding within the cranium. Ventricles are not dilated. Cortical sulci are prominent. Vascular: Unremarkable. Skull: Unremarkable. Sinuses/Orbits: There is mucosal thickening in the ethmoid sinus. Other: None IMPRESSION: No acute intracranial findings are seen in noncontrast CT brain. Atrophy. Chronic ethmoid sinusitis. Electronically Signed   By: Ernie Avena M.D.   On: 08/04/2021 14:51  ? ?CT Cervical Spine Wo Contrast ? ?Result Date: 08/04/2021 ?CLINICAL DATA:  Neck trauma, fall EXAM: CT CERVICAL SPINE WITHOUT CONTRAST TECHNIQUE: Multidetector CT imaging of the cervical spine was performed without intravenous contrast. Multiplanar CT image reconstructions were also generated. RADIATION DOSE REDUCTION: This exam was performed according to the departmental dose-optimization program which includes automated exposure control,  adjustment of the mA and/or kV according to patient size and/or use of iterative reconstruction technique. COMPARISON:  None Available. FINDINGS: Alignment: There is minimal 1 mm anterolisthesis at C3-C4 level. There is 2 mm anterolisthesis at C7-T1 level. These findings may be due to previous ligament injury and facet degeneration. Skull base and vertebrae: No recent fracture is seen. There is small 3 mm smooth marginated calcification posterior to the upper thoracic spine, possibly ligament calcification from previous injury. Degenerative changes are noted with bony spurs and facet hypertrophy at multiple levels. Soft tissues and spinal canal: There is extrinsic pressure over the ventral margin of thecal sac caused by posterior bony spurs from C4-C7 levels causing mild to moderate spinal stenosis. Disc levels: There is encroachment of neural foramina by bony spurs and facet hypertrophy from C2 to C7 levels. Upper chest: Pleural densities seen in both apices, more so on the right side, possibly suggesting scarring. Other: There is inhomogeneous attenuation in the thyroid. IMPRESSION: No recent fracture is seen in the cervical spine. 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 Electronically Signed   By: Ernie Avena M.D.   On: 08/04/2021 14:56  ? ?MR BRAIN WO CONTRAST ? ?Result Date: 08/05/2021 ?CLINICAL DATA:  Neuro deficit, acute, stroke suspected. Fall and altered mental status. EXAM: MRI HEAD WITHOUT CONTRAST TECHNIQUE: Multiplanar, multiecho pulse sequences of the brain and surrounding structures were obtained without intravenous contrast. COMPARISON:  Head CT 08/04/2021 FINDINGS: Brain: Head search brain T2 hyperintensities in the cerebral white matter and pons are nonspecific but compatible with mild chronic small vessel ischemic disease. Dilated perivascular spaces are noted in the left greater than right basal ganglia. Mild cerebral atrophy is within normal limits for  age. Vascular: Major intracranial vascular flow voids are preserved. Skull and upper cervical spine: No suspicious marrow lesion. Prominent median C1-2 arthropathy with a small amount of noncompressive ligam

## 2021-08-07 NOTE — Progress Notes (Signed)
And- ?[facility] Progress Note ? ?Patient: Alexander Price     ?PPI:[id number]  ?DOA: 08/04/2021   ?PCP: Philip Aspen, Limmie Patricia, MD  ? ?  ?  ?Brief hospital course: ?This is a 76 year old male who lives in an independent living facility and has a history of depression or lipidemia.  The patient was found on the floor yesterday after a fall.  He was noted to be confused and had lost control of his bladder.  According to his daughter who brought him to the hospital, the patient had not taken his medication for 2 days including his lithium.  In the ED he was noted to have a cough, temperature of 103, CK11 100, AST 58 and a positive COVID PCR. ?The patient was started on molnupiravir and admitted to the hospital. ? ?Subjective:  ?Mild cough today. ?  ?Assessment and Plan: ?Principal Problem: ?  COVID-19 virus infection ?-Symptoms include a cough which is better ?- No hypoxia and no pulmonary infiltrates  ?- Appears to be tolerating Molnupiravir  ?- awaiting PT eval to decide if he can return to independent living ?  ? ?Active Problems: ?Encephalopathy-acute ?- Possibly due to lack of medication for the past 2 days ?- The patient appears alert and oriented and is not having any trouble with mentation today ?- MRI brain without contrast has been is negative for acute abnormalities ? ?Urinary retention ?- needed I and O cath overnight x 3 on night of 5/14 ?- was able to void subsequently ? ? Non-traumatic rhabdomyolysis ?-stopped IVF ? ?Poor urine output/dehydration ?-resolving- drinking liquids now ? ?Severe depression ?- Continue Zyprexa, lithium, Cymbalta, Ritalin ? ?Hard of hearing ? ?  ?DVT prophylaxis: Lovenox ?  Code Status: Partial Code  ?Consultants: None ?Level of Care: Level of care: Med-Surg ?Disposition Plan:  ?Status is: Inpatient ?Remains inpatient appropriate because: waiting on PT for disposition ?Objective: ?  ?Vitals:  ? 08/06/21 2129 08/07/21 0125 08/07/21 0415 08/07/21 1346  ?BP: 104/73   122/77 113/65  ?Pulse: 69  61 72  ?Resp: 16  16 18   ?Temp: 98.3 ?F (36.8 ?C) 98.8 ?F (37.1 ?C) 98.5 ?F (36.9 ?C) 98.9 ?F (37.2 ?C)  ?TempSrc:  Oral    ?SpO2: 93%  93% 94%  ?Weight:      ?Height:      ? ?Filed Weights  ? 08/04/21 1309 08/04/21 2100  ?Weight: 88.3 kg 79.3 kg  ? ?Exam: ?General exam: Appears comfortable  ?HEENT: PERRLA, oral mucosa moist, no sclera icterus or thrush ?Respiratory system: Clear to auscultation. Respiratory effort normal. ?Cardiovascular system: S1 & S2 heard, regular rate and rhythm ?Gastrointestinal system: Abdomen soft, non-tender, nondistended. Normal bowel sounds   ?Central nervous system: Alert and oriented. No focal neurological deficits. ?Extremities: No cyanosis, clubbing or edema ?Skin: No rashes or ulcers ?Psychiatry:  Mood & affect appropriate.   ? ?Imaging and lab data was personally reviewed ? ? ? CBC: ?Recent Labs  ?Lab 08/04/21 ?1403  ?WBC 7.2  ?HGB 15.0  ?HCT 47.1  ?MCV 96.7  ?PLT 170  ? ? ?Basic Metabolic Panel: ?Recent Labs  ?Lab 08/04/21 ?1403  ?NA 140  ?K 3.8  ?CL 101  ?CO2 28  ?GLUCOSE 96  ?BUN 16  ?CREATININE 1.23  ?CALCIUM 9.2  ? ? ?GFR: ?Estimated Creatinine Clearance: 56.1 mL/min (by C-G formula based on SCr of 1.23 mg/dL). ? ?Scheduled Meds: ? donepezil  10 mg Oral QHS  ? DULoxetine  60 mg Oral Daily  ? enoxaparin (LOVENOX)  injection  40 mg Subcutaneous Q24H  ? feeding supplement  237 mL Oral BID BM  ? lithium carbonate  450 mg Oral QHS  ? lithium carbonate  300 mg Oral QHS  ? melatonin  10 mg Oral QHS  ? methylphenidate  10 mg Oral q AM  ? mirtazapine  15 mg Oral QHS  ? molnupiravir EUA  4 capsule Oral BID  ? multivitamin with minerals  1 tablet Oral Daily  ? OLANZapine  2.5 mg Oral QHS  ? ?Continuous Infusions: ? ? ? ? LOS: 3 days  ? ?Author: ?Calvert Cantor  ?08/07/2021 2:39 PM ?   ?

## 2021-08-07 NOTE — Evaluation (Signed)
Physical Therapy Evaluation ?Patient Details ?Name: Alexander Price ?MRN: [id number] ?DOB: 06-07-1945 ?Today's Date: 08/07/2021 ? ?History of Present Illness ? 76 year old male who lives in an independent living facility and has a history of depression and  lipidemia.  The patient was found on the floor  after a fall, confused with loss of bladder control.  In the ED he was noted to have a cough, temperature of 103,  and a positive COVID PCR.  ?Clinical Impression ? Pt admitted with above diagnosis.  ?See below for mobility. Encouraged pt to use cane at home until working with HHPT. Feel pt can return to ILF, discussed with pt and dtr and they are in agreement. He can have meals brought to his apartment initially, dtr will be checking in on pt as well. ? Pt currently with functional limitations due to the deficits listed below (see PT Problem List). Pt will benefit from skilled PT to increase their independence and safety with mobility to allow discharge to the venue listed below.   ?   ?   ? ?Recommendations for follow up therapy are one component of a multi-disciplinary discharge planning process, led by the attending physician.  Recommendations may be updated based on patient status, additional functional criteria and insurance authorization. ? ?Follow Up Recommendations Home health PT (HHOT) ? ?  ?Assistance Recommended at Discharge Intermittent Supervision/Assistance  ?Patient can return home with the following ? Assistance with cooking/housework;Assist for transportation;Help with stairs or ramp for entrance ? ?  ?Equipment Recommendations None recommended by PT  ?Recommendations for Other Services ?    ?  ?Functional Status Assessment Patient has had a recent decline in their functional status and demonstrates the ability to make significant improvements in function in a reasonable and predictable amount of time.  ? ?  ?Precautions / Restrictions Precautions ?Precautions: Fall ?Restrictions ?Weight Bearing  Restrictions: No  ? ?  ? ?Mobility ? Bed Mobility ?Overal bed mobility: Needs Assistance ?Bed Mobility: Supine to Sit, Sit to Supine ?  ?  ?Supine to sit: Min guard ?Sit to supine: Min guard ?  ?General bed mobility comments: for safety, incr time, no physical assist ?  ? ?Transfers ?Overall transfer level: Needs assistance ?Equipment used: None ?Transfers: Sit to/from Stand ?  ?  ?  ?  ?  ?  ?General transfer comment: cues for hand placement, decr use of UEs as able ?  ? ?Ambulation/Gait ?Ambulation/Gait assistance: Min guard ?Gait Distance (Feet): 120 Feet ?Assistive device: None, 1 person hand held assist ?Gait Pattern/deviations: Step-through pattern, Decreased stride length, Trunk flexed, Narrow base of support, Shuffle, Wide base of support ?  ?  ?  ?General Gait Details: hip and trunk forward flexion (baseline per pt and dtr). cues to correct  intermittent shuffling  and to incr BOS, HHA needed last 60' d/t fatigue and slight unsteady gait, no overt LOB; SpO2=98% on RA. HR 74 ? ?Stairs ?  ?  ?  ?  ?  ? ?Wheelchair Mobility ?  ? ?Modified Rankin (Stroke Patients Only) ?  ? ?  ? ?Balance Overall balance assessment: History of Falls, Needs assistance ?Sitting-balance support: No upper extremity supported, Feet supported ?Sitting balance-Leahy Scale: Good ?  ?  ?Standing balance support: During functional activity, No upper extremity supported ?Standing balance-Leahy Scale: Fair ?  ?Single Leg Stance - Right Leg: 0 ?Single Leg Stance - Left Leg: 0 ?  ?  ?  ?  ?High level balance activites: Side stepping, Direction changes, Turns ?  High Level Balance Comments: min/guard for dynamic balance activity ?  ?  ?  ?   ? ? ? ?Pertinent Vitals/Pain Pain Assessment ?Pain Assessment: No/denies pain  ? ? ?Home Living Family/patient expects to be discharged to:: Private residence ?  ?Available Help at Discharge: Available PRN/intermittently ?Type of Home: Apartment (ILF) ?Home Access: Level entry ?  ?  ?  ?Home Layout: One  level ?Home Equipment: Agricultural consultant (2 wheels);Cane - single point ?   ?  ?Prior Function Prior Level of Function : Independent/Modified Independent ?  ?  ?  ?  ?  ?  ?  ?  ?  ? ? ?Hand Dominance  ?   ? ?  ?Extremity/Trunk Assessment  ? Upper Extremity Assessment ?Upper Extremity Assessment: Defer to OT evaluation ?  ? ?Lower Extremity Assessment ?Lower Extremity Assessment: Generalized weakness ?  ? ?   ?Communication  ? Communication: No difficulties  ?Cognition Arousal/Alertness: Awake/alert ?Behavior During Therapy: Flat affect ?Overall Cognitive Status: Within Functional Limits for tasks assessed ?  ?  ?  ?  ?  ?  ?  ?  ?  ?  ?  ?  ?  ?  ?  ?  ?  ?  ?  ? ?  ?General Comments   ? ?  ?Exercises    ? ?Assessment/Plan  ?  ?PT Assessment Patient needs continued PT services  ?PT Problem List Decreased strength;Decreased range of motion;Decreased activity tolerance;Decreased balance;Decreased knowledge of use of DME;Pain;Decreased mobility ? ?   ?  ?PT Treatment Interventions DME instruction;Therapeutic exercise;Gait training;Functional mobility training;Therapeutic activities;Patient/family education   ? ?PT Goals (Current goals can be found in the Care Plan section)  ?Acute Rehab PT Goals ?Patient Stated Goal: home soon ?PT Goal Formulation: With patient/family ?Time For Goal Achievement: 08/21/21 ?Potential to Achieve Goals: Good ? ?  ?Frequency Min 3X/week ?  ? ? ?Co-evaluation   ?  ?  ?  ?  ? ? ?  ?AM-PAC PT "6 Clicks" Mobility  ?Outcome Measure Help needed turning from your back to your side while in a flat bed without using bedrails?: A Little ?Help needed moving from lying on your back to sitting on the side of a flat bed without using bedrails?: A Little ?Help needed moving to and from a bed to a chair (including a wheelchair)?: A Little ?Help needed standing up from a chair using your arms (e.g., wheelchair or bedside chair)?: A Little ?Help needed to walk in hospital room?: A Little ?Help needed climbing  3-5 steps with a railing? : A Little ?6 Click Score: 18 ? ?  ?End of Session Equipment Utilized During Treatment: Gait belt ?Activity Tolerance: Patient tolerated treatment well ?Patient left: with call bell/phone within reach;in bed;with bed alarm set;with family/visitor present ?Nurse Communication: Mobility status ?PT Visit Diagnosis: Unsteadiness on feet (R26.81);Difficulty in walking, not elsewhere classified (R26.2);Muscle weakness (generalized) (M62.81);History of falling (Z91.81) ?  ? ?Time: 1445-1510 ?PT Time Calculation (min) (ACUTE ONLY): 25 min ? ? ?Charges:   PT Evaluation ?$PT Eval Low Complexity: 1 Low ?PT Treatments ?$Gait Training: 8-22 mins ?  ?   ? ? ?Delice Bison, PT ? ?Acute Rehab Dept [facility]) [phone] ?Pager [phone] ? ?08/07/2021 ? ? ?Alexander Price ?08/07/2021, 5:03 PM ? ?

## 2021-08-10 LAB — CULTURE, BLOOD (ROUTINE X 2)
Culture: NO GROWTH
Culture: NO GROWTH
Special Requests: ADEQUATE
Special Requests: ADEQUATE

## 2021-08-14 DIAGNOSIS — R278 Other lack of coordination: Secondary | ICD-10-CM | POA: Diagnosis not present

## 2021-08-14 DIAGNOSIS — M6281 Muscle weakness (generalized): Secondary | ICD-10-CM | POA: Diagnosis not present

## 2021-08-14 DIAGNOSIS — Z9181 History of falling: Secondary | ICD-10-CM | POA: Diagnosis not present

## 2021-08-15 DIAGNOSIS — R2689 Other abnormalities of gait and mobility: Secondary | ICD-10-CM | POA: Diagnosis not present

## 2021-08-15 DIAGNOSIS — M6281 Muscle weakness (generalized): Secondary | ICD-10-CM | POA: Diagnosis not present

## 2021-08-16 DIAGNOSIS — R41841 Cognitive communication deficit: Secondary | ICD-10-CM | POA: Diagnosis not present

## 2021-08-16 DIAGNOSIS — R278 Other lack of coordination: Secondary | ICD-10-CM | POA: Diagnosis not present

## 2021-08-16 DIAGNOSIS — R488 Other symbolic dysfunctions: Secondary | ICD-10-CM | POA: Diagnosis not present

## 2021-08-16 DIAGNOSIS — M6281 Muscle weakness (generalized): Secondary | ICD-10-CM | POA: Diagnosis not present

## 2021-08-16 DIAGNOSIS — Z9181 History of falling: Secondary | ICD-10-CM | POA: Diagnosis not present

## 2021-08-17 DIAGNOSIS — R2689 Other abnormalities of gait and mobility: Secondary | ICD-10-CM | POA: Diagnosis not present

## 2021-08-17 DIAGNOSIS — M6281 Muscle weakness (generalized): Secondary | ICD-10-CM | POA: Diagnosis not present

## 2021-08-20 DIAGNOSIS — R488 Other symbolic dysfunctions: Secondary | ICD-10-CM | POA: Diagnosis not present

## 2021-08-20 DIAGNOSIS — M6281 Muscle weakness (generalized): Secondary | ICD-10-CM | POA: Diagnosis not present

## 2021-08-20 DIAGNOSIS — R2689 Other abnormalities of gait and mobility: Secondary | ICD-10-CM | POA: Diagnosis not present

## 2021-08-20 DIAGNOSIS — R41841 Cognitive communication deficit: Secondary | ICD-10-CM | POA: Diagnosis not present

## 2021-08-21 DIAGNOSIS — R41841 Cognitive communication deficit: Secondary | ICD-10-CM | POA: Diagnosis not present

## 2021-08-21 DIAGNOSIS — R488 Other symbolic dysfunctions: Secondary | ICD-10-CM | POA: Diagnosis not present

## 2021-08-21 DIAGNOSIS — R278 Other lack of coordination: Secondary | ICD-10-CM | POA: Diagnosis not present

## 2021-08-21 DIAGNOSIS — M6281 Muscle weakness (generalized): Secondary | ICD-10-CM | POA: Diagnosis not present

## 2021-08-21 DIAGNOSIS — R2689 Other abnormalities of gait and mobility: Secondary | ICD-10-CM | POA: Diagnosis not present

## 2021-08-21 DIAGNOSIS — Z9181 History of falling: Secondary | ICD-10-CM | POA: Diagnosis not present

## 2021-08-24 DIAGNOSIS — R2689 Other abnormalities of gait and mobility: Secondary | ICD-10-CM | POA: Diagnosis not present

## 2021-08-24 DIAGNOSIS — M6281 Muscle weakness (generalized): Secondary | ICD-10-CM | POA: Diagnosis not present

## 2021-08-28 DIAGNOSIS — R2689 Other abnormalities of gait and mobility: Secondary | ICD-10-CM | POA: Diagnosis not present

## 2021-08-28 DIAGNOSIS — M6281 Muscle weakness (generalized): Secondary | ICD-10-CM | POA: Diagnosis not present

## 2021-08-31 DIAGNOSIS — R2689 Other abnormalities of gait and mobility: Secondary | ICD-10-CM | POA: Diagnosis not present

## 2021-08-31 DIAGNOSIS — M6281 Muscle weakness (generalized): Secondary | ICD-10-CM | POA: Diagnosis not present

## 2021-10-12 DIAGNOSIS — F332 Major depressive disorder, recurrent severe without psychotic features: Secondary | ICD-10-CM | POA: Diagnosis not present

## 2021-10-12 DIAGNOSIS — F429 Obsessive-compulsive disorder, unspecified: Secondary | ICD-10-CM | POA: Diagnosis not present

## 2021-11-27 ENCOUNTER — Telehealth: Payer: Self-pay | Admitting: Internal Medicine

## 2021-11-27 DIAGNOSIS — F333 Major depressive disorder, recurrent, severe with psychotic symptoms: Secondary | ICD-10-CM

## 2021-11-27 DIAGNOSIS — T887XXA Unspecified adverse effect of drug or medicament, initial encounter: Secondary | ICD-10-CM

## 2021-11-27 DIAGNOSIS — E785 Hyperlipidemia, unspecified: Secondary | ICD-10-CM

## 2021-11-27 DIAGNOSIS — R7989 Other specified abnormal findings of blood chemistry: Secondary | ICD-10-CM

## 2021-11-27 NOTE — Telephone Encounter (Signed)
Pt's daughter/healthcare POA [name]) called to ask if MD could put in an order for the following:  CBC with diff Comprehensive Metabolic panel Lipid panel Serum Lithium Thyroid panel  As per request from Ellis Savage, Psychiatric NP  [name]  [phone]

## 2021-11-27 NOTE — Telephone Encounter (Signed)
Lab appointment scheduled for 11/29/21 Office visit scheduled for 12/18/21

## 2021-11-29 ENCOUNTER — Other Ambulatory Visit (INDEPENDENT_AMBULATORY_CARE_PROVIDER_SITE_OTHER): Payer: Medicare PPO

## 2021-11-29 DIAGNOSIS — E785 Hyperlipidemia, unspecified: Secondary | ICD-10-CM | POA: Diagnosis not present

## 2021-11-29 DIAGNOSIS — R7989 Other specified abnormal findings of blood chemistry: Secondary | ICD-10-CM

## 2021-11-29 DIAGNOSIS — F333 Major depressive disorder, recurrent, severe with psychotic symptoms: Secondary | ICD-10-CM | POA: Diagnosis not present

## 2021-11-29 DIAGNOSIS — T887XXA Unspecified adverse effect of drug or medicament, initial encounter: Secondary | ICD-10-CM

## 2021-11-29 LAB — LIPID PANEL
Cholesterol: 150 mg/dL (ref 0–200)
HDL: 44 mg/dL (ref >39.00)
LDL Cholesterol: 81 mg/dL (ref 0–99)
NonHDL: 106.21
Total CHOL/HDL Ratio: 3
Triglycerides: 125 mg/dL (ref 0.0–149.0)
VLDL: 25 mg/dL (ref 0.0–40.0)

## 2021-11-29 LAB — CBC WITH DIFFERENTIAL/PLATELET
Basophils Absolute: 0 10*3/uL (ref 0.0–0.1)
Basophils Relative: 0.8 % (ref 0.0–3.0)
Eosinophils Absolute: 0.3 10*3/uL (ref 0.0–0.7)
Eosinophils Relative: 5.4 % — ABNORMAL HIGH (ref 0.0–5.0)
HCT: 45.3 % (ref 39.0–52.0)
Hemoglobin: 14.8 g/dL (ref 13.0–17.0)
Lymphocytes Relative: 23 % (ref 12.0–46.0)
Lymphs Abs: 1.3 10*3/uL (ref 0.7–4.0)
MCHC: 32.6 g/dL (ref 30.0–36.0)
MCV: 96.8 fl (ref 78.0–100.0)
Monocytes Absolute: 0.4 10*3/uL (ref 0.1–1.0)
Monocytes Relative: 7.7 % (ref 3.0–12.0)
Neutro Abs: 3.7 10*3/uL (ref 1.4–7.7)
Neutrophils Relative %: 63.1 % (ref 43.0–77.0)
Platelets: 233 10*3/uL (ref 150.0–400.0)
RBC: 4.68 Mil/uL (ref 4.22–5.81)
RDW: 12.9 % (ref 11.5–15.5)
WBC: 5.8 10*3/uL (ref 4.0–10.5)

## 2021-11-29 LAB — TSH: TSH: 6.93 u[IU]/mL — ABNORMAL HIGH (ref 0.35–5.50)

## 2021-11-30 LAB — LITHIUM LEVEL: Lithium Lvl: 0.7 mmol/L (ref 0.6–1.2)

## 2021-12-06 ENCOUNTER — Encounter: Payer: Self-pay | Admitting: Internal Medicine

## 2021-12-06 DIAGNOSIS — E039 Hypothyroidism, unspecified: Secondary | ICD-10-CM | POA: Insufficient documentation

## 2021-12-06 DIAGNOSIS — R7989 Other specified abnormal findings of blood chemistry: Secondary | ICD-10-CM | POA: Insufficient documentation

## 2021-12-12 DIAGNOSIS — F341 Dysthymic disorder: Secondary | ICD-10-CM | POA: Diagnosis not present

## 2021-12-12 DIAGNOSIS — F429 Obsessive-compulsive disorder, unspecified: Secondary | ICD-10-CM | POA: Diagnosis not present

## 2021-12-12 DIAGNOSIS — F0390 Unspecified dementia without behavioral disturbance: Secondary | ICD-10-CM | POA: Diagnosis not present

## 2021-12-18 ENCOUNTER — Encounter: Payer: Self-pay | Admitting: Internal Medicine

## 2021-12-18 ENCOUNTER — Ambulatory Visit: Payer: Medicare PPO | Admitting: Internal Medicine

## 2021-12-18 VITALS — BP 120/80 | HR 60 | Temp 98.2°F | Wt 205.0 lb

## 2021-12-18 DIAGNOSIS — F333 Major depressive disorder, recurrent, severe with psychotic symptoms: Secondary | ICD-10-CM | POA: Diagnosis not present

## 2021-12-18 DIAGNOSIS — Z23 Encounter for immunization: Secondary | ICD-10-CM | POA: Diagnosis not present

## 2021-12-18 DIAGNOSIS — R7989 Other specified abnormal findings of blood chemistry: Secondary | ICD-10-CM

## 2021-12-18 LAB — TSH: TSH: 4.99 u[IU]/mL (ref 0.35–5.50)

## 2021-12-18 LAB — T3, FREE: T3, Free: 2.9 pg/mL (ref 2.3–4.2)

## 2021-12-18 LAB — T4, FREE: Free T4: 0.76 ng/dL (ref 0.60–1.60)

## 2021-12-18 NOTE — Progress Notes (Signed)
Established Patient Office Visit     CC/Reason for Visit: Lab tests  HPI: Alexander Price is a 76 y.o. male who is coming in today for the above mentioned reasons.  He was sent here by his psychiatrist to discuss an elevated TSH of 6.930.  No recent T3-T4 on file.  They are requesting a flu vaccine and an out of facility DNR form.  He is here with his daughter Alexander Price.  Past Medical/Surgical History: Past Medical History:  Diagnosis Date   Depression    sees Alexander Savage NP at [facility]    Hyperlipidemia     Past Surgical History:  Procedure Laterality Date   TONSILLECTOMY      Social History:  reports that he has never smoked. He has never used smokeless tobacco. He reports that he does not drink alcohol and does not use drugs.  Allergies: No Known Allergies  Family History:  Family History  Family history unknown: Yes     Current Outpatient Medications:    donepezil (ARICEPT) 5 MG tablet, Take 10 mg by mouth at bedtime., Disp: , Rfl:    DULoxetine (CYMBALTA) 60 MG capsule, Take 90 mg by mouth in the morning., Disp: , Rfl:    lithium carbonate (ESKALITH) 450 MG CR tablet, Take 450 mg by mouth at bedtime. A total dose of 750mg  nightly, Disp: , Rfl:    lithium carbonate (LITHOBID) 300 MG CR tablet, Take 300 mg by mouth at bedtime. A total dose of 750mg  nightly, Disp: , Rfl:    Melatonin 10 MG TABS, Take by mouth., Disp: , Rfl:    methylphenidate (RITALIN) 10 MG tablet, Take 10 mg by mouth in the morning., Disp: , Rfl:    mirtazapine (REMERON) 30 MG tablet, Take 15 mg by mouth at bedtime., Disp: , Rfl:    Multiple Vitamin (MULTIVITAMIN WITH MINERALS) TABS tablet, Take 1 tablet by mouth daily., Disp: , Rfl:    OLANZapine (ZYPREXA) 5 MG tablet, Take 2.5 mg by mouth at bedtime., Disp: , Rfl:    simvastatin (ZOCOR) 40 MG tablet, TAKE 1 TABLET BY MOUTH EVERYDAY AT BEDTIME (Patient taking differently: Take 40 mg by mouth at bedtime.), Disp: 90 tablet, Rfl:  1  Review of Systems:  Constitutional: Denies fever, chills, diaphoresis, appetite change and fatigue.  HEENT: Denies photophobia, eye pain, redness, hearing loss, ear pain, congestion, sore throat, rhinorrhea, sneezing, mouth sores, trouble swallowing, neck pain, neck stiffness and tinnitus.   Respiratory: Denies SOB, DOE, cough, chest tightness,  and wheezing.   Cardiovascular: Denies chest pain, palpitations and leg swelling.  Gastrointestinal: Denies nausea, vomiting, abdominal pain, diarrhea, constipation, blood in stool and abdominal distention.  Genitourinary: Denies dysuria, urgency, frequency, hematuria, flank pain and difficulty urinating.  Endocrine: Denies: hot or cold intolerance, sweats, changes in hair or nails, polyuria, polydipsia. Musculoskeletal: Denies myalgias, back pain, joint swelling, arthralgias and gait problem.  Skin: Denies pallor, rash and wound.  Neurological: Denies dizziness, seizures, syncope, weakness, light-headedness, numbness and headaches.  Hematological: Denies adenopathy. Easy bruising, personal or family bleeding history  Psychiatric/Behavioral: Denies suicidal ideation, mood changes, confusion, nervousness, sleep disturbance and agitation    Physical Exam: Vitals:   12/18/21 1130  BP: 120/80  Pulse: 60  Temp: 98.2 F (36.8 C)  TempSrc: Oral  SpO2: 96%  Weight: 205 lb (93 kg)    Body mass index is 27.8 kg/m.   Constitutional: NAD, calm, comfortable Eyes: PERRL, lids and conjunctivae normal, wears corrective lenses ENMT:  Mucous membranes are moist.  Psychiatric: Normal judgment and insight. Alert and oriented x 3. Normal mood.    Impression and Plan:  Elevated TSH - Plan: TSH, T4, free, T3, free, T3, free, T4, free, TSH  Needs flu shot - Plan: Flu Vaccine QUAD High Dose(Fluad)  -Repeat TSH, add free T3 and free T4 to determine if treatment for hypothyroidism is needed. -Flu vaccine administered today. -Out of facility DNR form  has been signed.   Time spent:21 minutes reviewing chart, interviewing and examining patient and formulating plan of care.     Alexander Frohlich, MD [facility] at [facility]

## 2021-12-19 ENCOUNTER — Other Ambulatory Visit: Payer: Self-pay | Admitting: *Deleted

## 2021-12-19 DIAGNOSIS — R7989 Other specified abnormal findings of blood chemistry: Secondary | ICD-10-CM

## 2022-02-01 ENCOUNTER — Other Ambulatory Visit: Payer: Self-pay | Admitting: Internal Medicine

## 2022-02-01 DIAGNOSIS — E785 Hyperlipidemia, unspecified: Secondary | ICD-10-CM

## 2022-03-27 ENCOUNTER — Other Ambulatory Visit: Payer: Medicare PPO

## 2022-04-11 DIAGNOSIS — F341 Dysthymic disorder: Secondary | ICD-10-CM | POA: Diagnosis not present

## 2022-05-24 DIAGNOSIS — Z79899 Other long term (current) drug therapy: Secondary | ICD-10-CM | POA: Diagnosis not present

## 2022-08-07 ENCOUNTER — Other Ambulatory Visit: Payer: Self-pay | Admitting: Internal Medicine

## 2022-08-07 DIAGNOSIS — E785 Hyperlipidemia, unspecified: Secondary | ICD-10-CM

## 2022-08-27 DIAGNOSIS — D485 Neoplasm of uncertain behavior of skin: Secondary | ICD-10-CM | POA: Diagnosis not present

## 2022-08-27 DIAGNOSIS — C44319 Basal cell carcinoma of skin of other parts of face: Secondary | ICD-10-CM | POA: Diagnosis not present

## 2022-09-02 DIAGNOSIS — D485 Neoplasm of uncertain behavior of skin: Secondary | ICD-10-CM | POA: Diagnosis not present

## 2022-11-12 ENCOUNTER — Other Ambulatory Visit: Payer: Self-pay | Admitting: Internal Medicine

## 2022-11-12 DIAGNOSIS — E785 Hyperlipidemia, unspecified: Secondary | ICD-10-CM

## 2022-11-19 DIAGNOSIS — F341 Dysthymic disorder: Secondary | ICD-10-CM | POA: Diagnosis not present

## 2022-11-19 DIAGNOSIS — F330 Major depressive disorder, recurrent, mild: Secondary | ICD-10-CM | POA: Diagnosis not present

## 2022-11-19 DIAGNOSIS — F429 Obsessive-compulsive disorder, unspecified: Secondary | ICD-10-CM | POA: Diagnosis not present

## 2022-12-10 ENCOUNTER — Encounter: Payer: Self-pay | Admitting: Internal Medicine

## 2022-12-11 ENCOUNTER — Ambulatory Visit: Payer: Medicare PPO | Admitting: Internal Medicine

## 2022-12-11 ENCOUNTER — Encounter: Payer: Self-pay | Admitting: Internal Medicine

## 2022-12-11 VITALS — BP 124/84 | HR 70 | Temp 98.3°F | Wt 242.8 lb

## 2022-12-11 DIAGNOSIS — H6123 Impacted cerumen, bilateral: Secondary | ICD-10-CM | POA: Diagnosis not present

## 2022-12-11 DIAGNOSIS — Z23 Encounter for immunization: Secondary | ICD-10-CM | POA: Diagnosis not present

## 2022-12-11 NOTE — Addendum Note (Signed)
Addended by: Kern Reap B on: 12/11/2022 11:58 AM   Modules accepted: Orders

## 2022-12-11 NOTE — Progress Notes (Signed)
     Established Patient Office Visit     CC/Reason for Visit: Decreased hearing  HPI: Alexander Price is a 77 y.o. male who is coming in today for the above mentioned reasons.  Has been noticing decreased hearing.  Daughter is concerned about cerumen impaction.  This has happened in the past.  Requesting flu vaccine.  Past Medical/Surgical History: Past Medical History:  Diagnosis Date   Depression    sees Ellis Savage NP at [facility]    Hyperlipidemia     Past Surgical History:  Procedure Laterality Date   TONSILLECTOMY      Social History:  reports that he has never smoked. He has never used smokeless tobacco. He reports that he does not drink alcohol and does not use drugs.  Allergies: No Known Allergies  Family History:  Family History  Family history unknown: Yes     Current Outpatient Medications:    donepezil (ARICEPT) 5 MG tablet, Take 10 mg by mouth at bedtime., Disp: , Rfl:    DULoxetine (CYMBALTA) 60 MG capsule, Take 90 mg by mouth in the morning., Disp: , Rfl:    lithium carbonate (ESKALITH) 450 MG CR tablet, Take 450 mg by mouth at bedtime. A total dose of 750mg  nightly, Disp: , Rfl:    lithium carbonate (LITHOBID) 300 MG CR tablet, Take 300 mg by mouth at bedtime. A total dose of 750mg  nightly, Disp: , Rfl:    Melatonin 10 MG TABS, Take by mouth., Disp: , Rfl:    methylphenidate (RITALIN) 10 MG tablet, Take 10 mg by mouth in the morning., Disp: , Rfl:    mirtazapine (REMERON) 30 MG tablet, Take 15 mg by mouth at bedtime., Disp: , Rfl:    Multiple Vitamin (MULTIVITAMIN WITH MINERALS) TABS tablet, Take 1 tablet by mouth daily., Disp: , Rfl:    OLANZapine (ZYPREXA) 5 MG tablet, Take 2.5 mg by mouth at bedtime., Disp: , Rfl:    simvastatin (ZOCOR) 40 MG tablet, TAKE 1 TABLET BY MOUTH EVERYDAY AT BEDTIME, Disp: 90 tablet, Rfl: 0  Review of Systems:  Negative unless indicated in HPI.   Physical Exam: Vitals:   12/11/22 1046  BP: 124/84  Pulse:  70  Temp: 98.3 F (36.8 C)  TempSrc: Oral  SpO2: 99%  Weight: 242 lb 12.8 oz (110.1 kg)    Body mass index is 32.93 kg/m.   Physical Exam HENT:     Right Ear: Decreased hearing noted. There is impacted cerumen.     Left Ear: Decreased hearing noted. There is impacted cerumen.      Impression and Plan:  Bilateral hearing loss due to cerumen impaction  Immunization due   -Flu vaccine administered today.  Cerumen Desimpaction  -After patient consent was obtained, warm water was applied and gentle ear lavage performed on bilateral ears. There were no complications and following the desimpaction the tympanic membranes were visible. Tympanic membranes are intact following the procedure. Auditory canals are normal. The patient reported relief of symptoms after removal of cerumen.   Time spent:23 minutes reviewing chart, interviewing and examining patient and formulating plan of care.     Chaya Jan, MD [facility] [place] [facility] at [facility]

## 2022-12-11 NOTE — Telephone Encounter (Signed)
error 

## 2022-12-12 DIAGNOSIS — Z79899 Other long term (current) drug therapy: Secondary | ICD-10-CM | POA: Diagnosis not present

## 2022-12-13 LAB — LAB REPORT - SCANNED: eGFR: 60

## 2023-01-31 IMAGING — CT CT CERVICAL SPINE W/O CM
3 of 4 series · 11 of 33 positions shown, 13 images · non-contrast
Comparison: None Available.

CLINICAL DATA: Neck trauma, fall



[Series 5: cor bone · coronal · 0.30mm/px · 3 of 65 slices shown]
[im 13/65  bone]
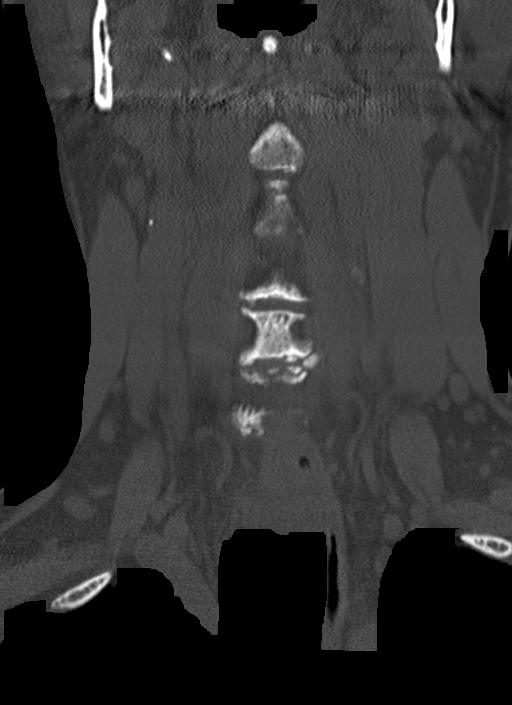
[im 26/65  bone]
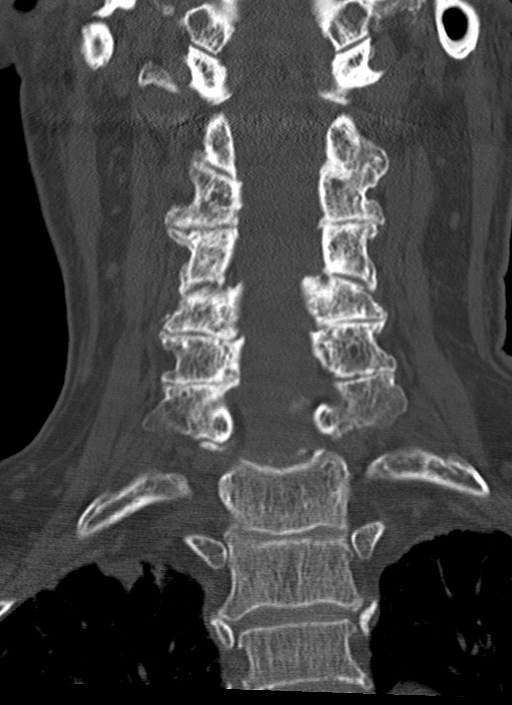
[im 39/65  bone]
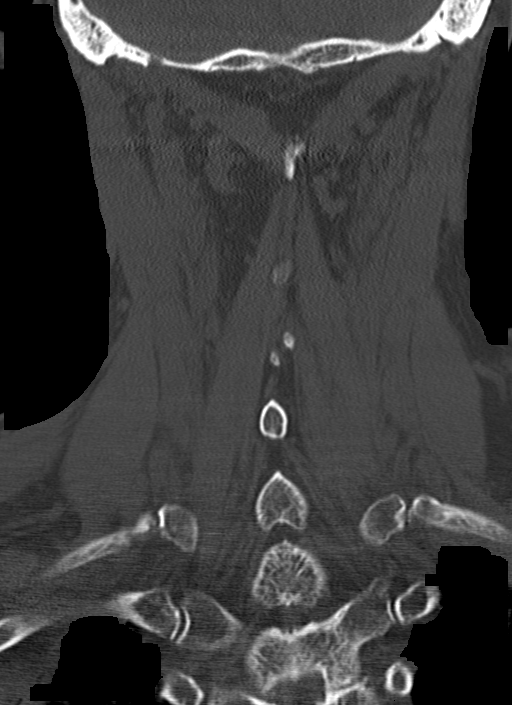

[Series 6: sag bone · sagittal · 0.28mm/px · 5 of 59 slices shown, 6 images]
[im 20/59  bone]
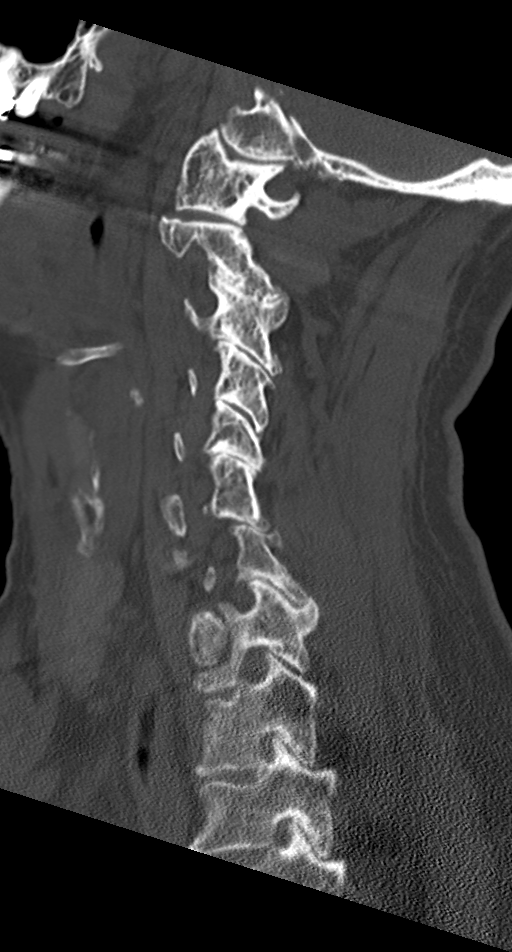
[im 25/59  bone]
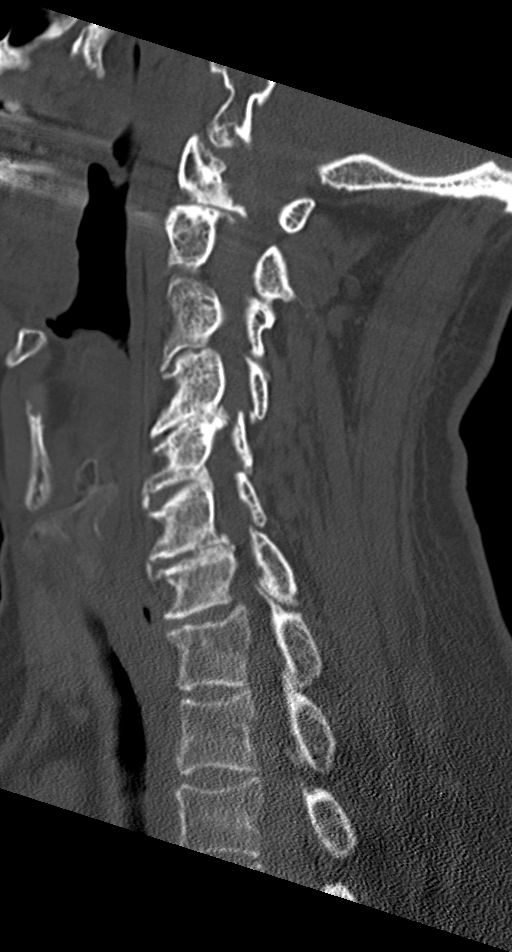
[im 30/59  soft-tissue]
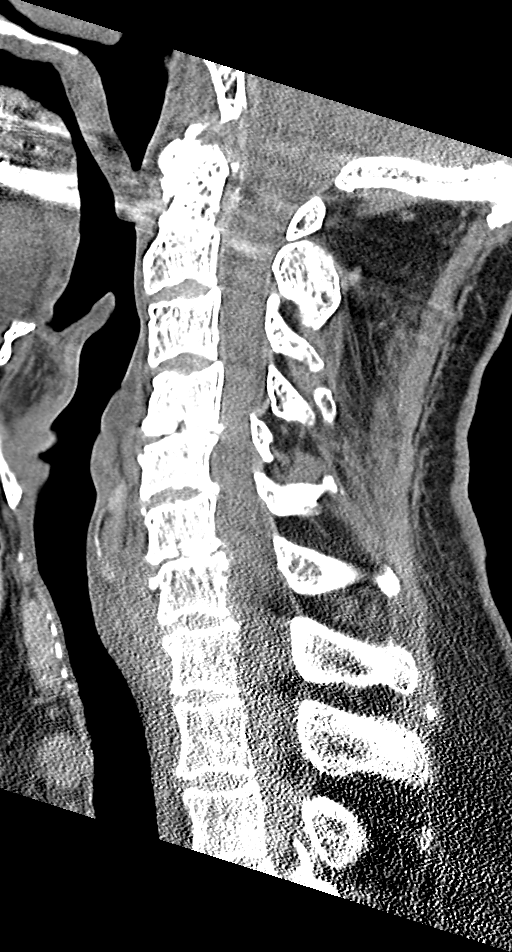
[im 30/59  bone]
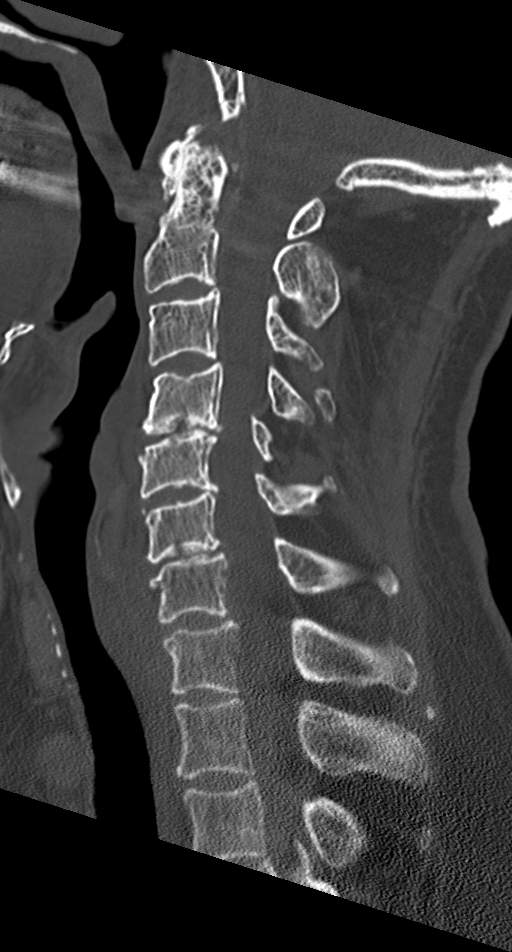
[im 34/59  bone]
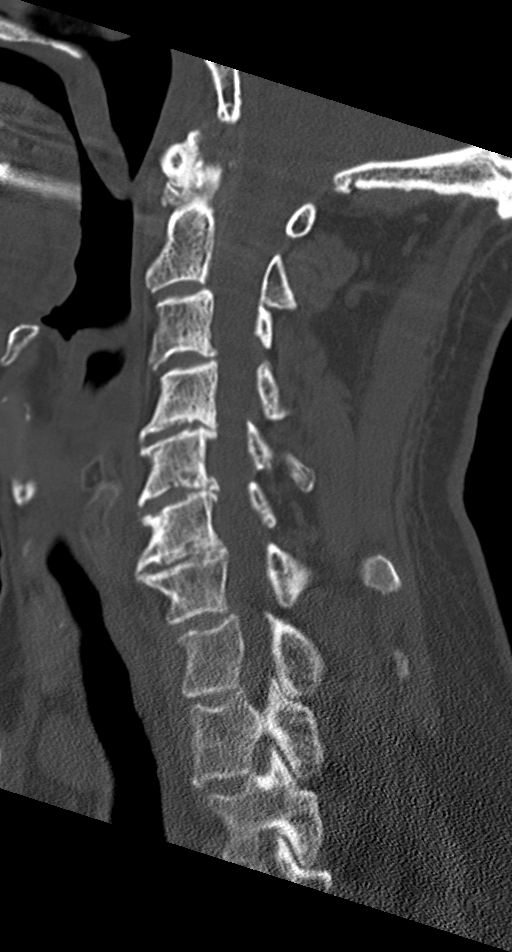
[im 39/59  bone]
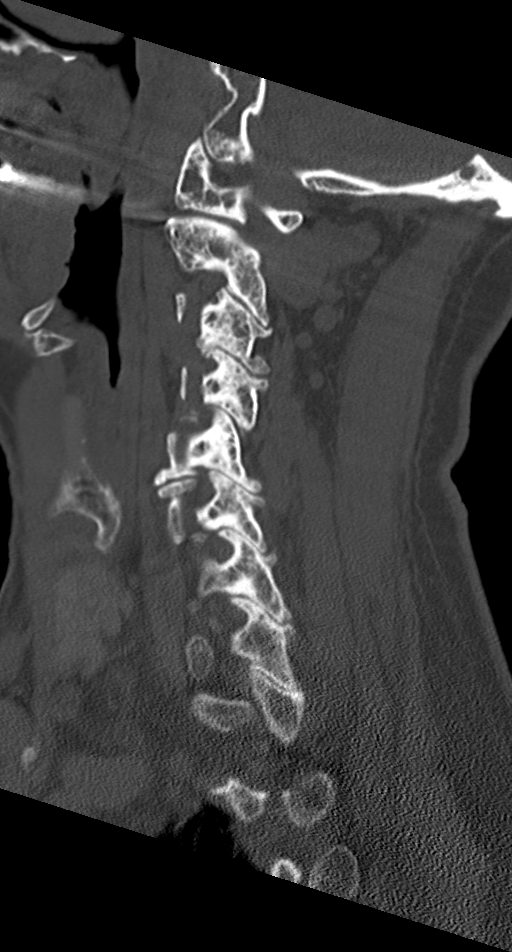

[Series 7: orthogonal axials · axial · 0.32mm/px · z∈[-423,-293]mm · 3 of 98 slices shown, 4 images]
[im 17/98  soft-tissue]
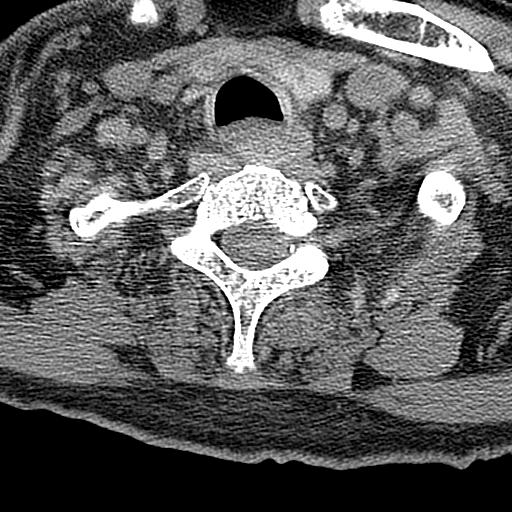
[im 17/98  bone]
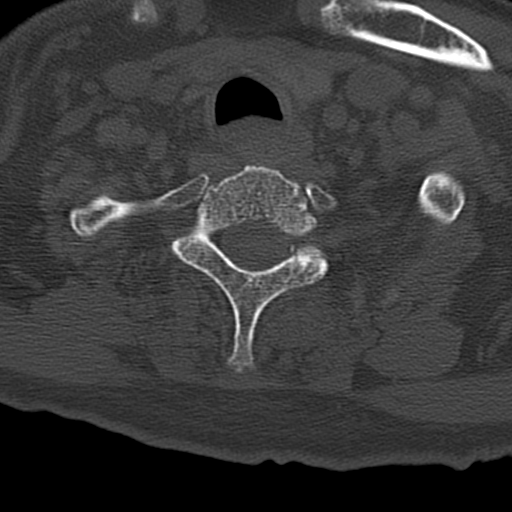
[im 49/98  bone]
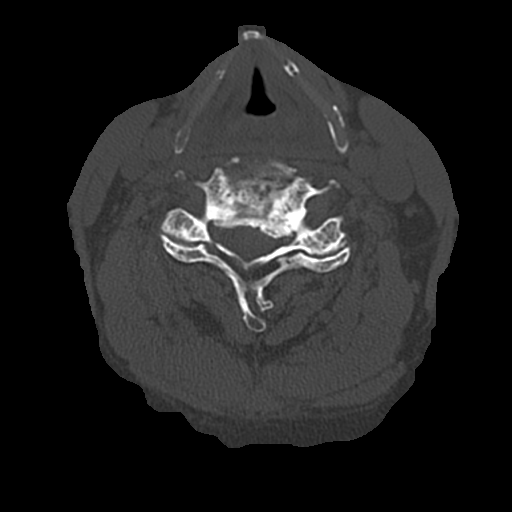
[im 81/98  bone]
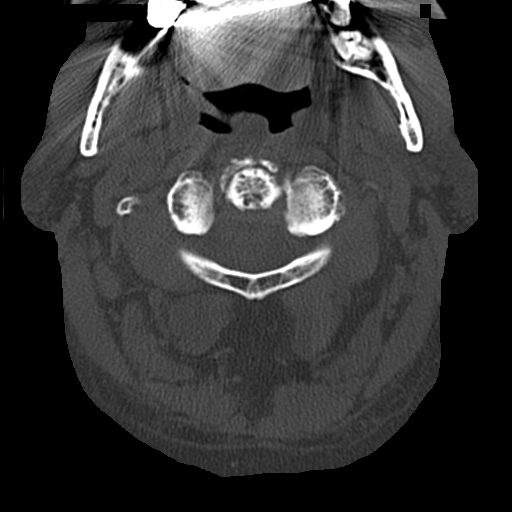

[11 of 33 positions shown; findings below may reference images not displayed]

FINDINGS: Alignment: There is minimal 1 mm anterolisthesis at C3-C4 level.
There is 2 mm anterolisthesis at C7-T1 level. These findings may be
due to previous ligament injury and facet degeneration.

Skull base and vertebrae: No recent fracture is seen. There is small
3 mm smooth marginated calcification posterior to the upper thoracic
spine, possibly ligament calcification from previous injury.
Degenerative changes are noted with bony spurs and facet hypertrophy
at multiple levels.

Soft tissues and spinal canal: There is extrinsic pressure over the
ventral margin of thecal sac caused by posterior bony spurs from
C4-C7 levels causing mild to moderate spinal stenosis.

Disc levels: There is encroachment of neural foramina by bony spurs
and facet hypertrophy from C2 to C7 levels.

Upper chest: Pleural densities seen in both apices, more so on the
right side, possibly suggesting scarring.

Other: There is inhomogeneous attenuation in the thyroid.
IMPRESSION: No recent fracture is seen in the cervical spine. Cervical
spondylosis with encroachment of neural foramina from C2-C7 levels.

## 2023-02-01 IMAGING — DX DG PELVIS 1-2V
1 series · 1 of 1 positions shown · non-contrast
Comparison: None Available.

CLINICAL DATA: Fall

EXAM:
PELVIS - 1-2 VIEW

[pelvis ap]
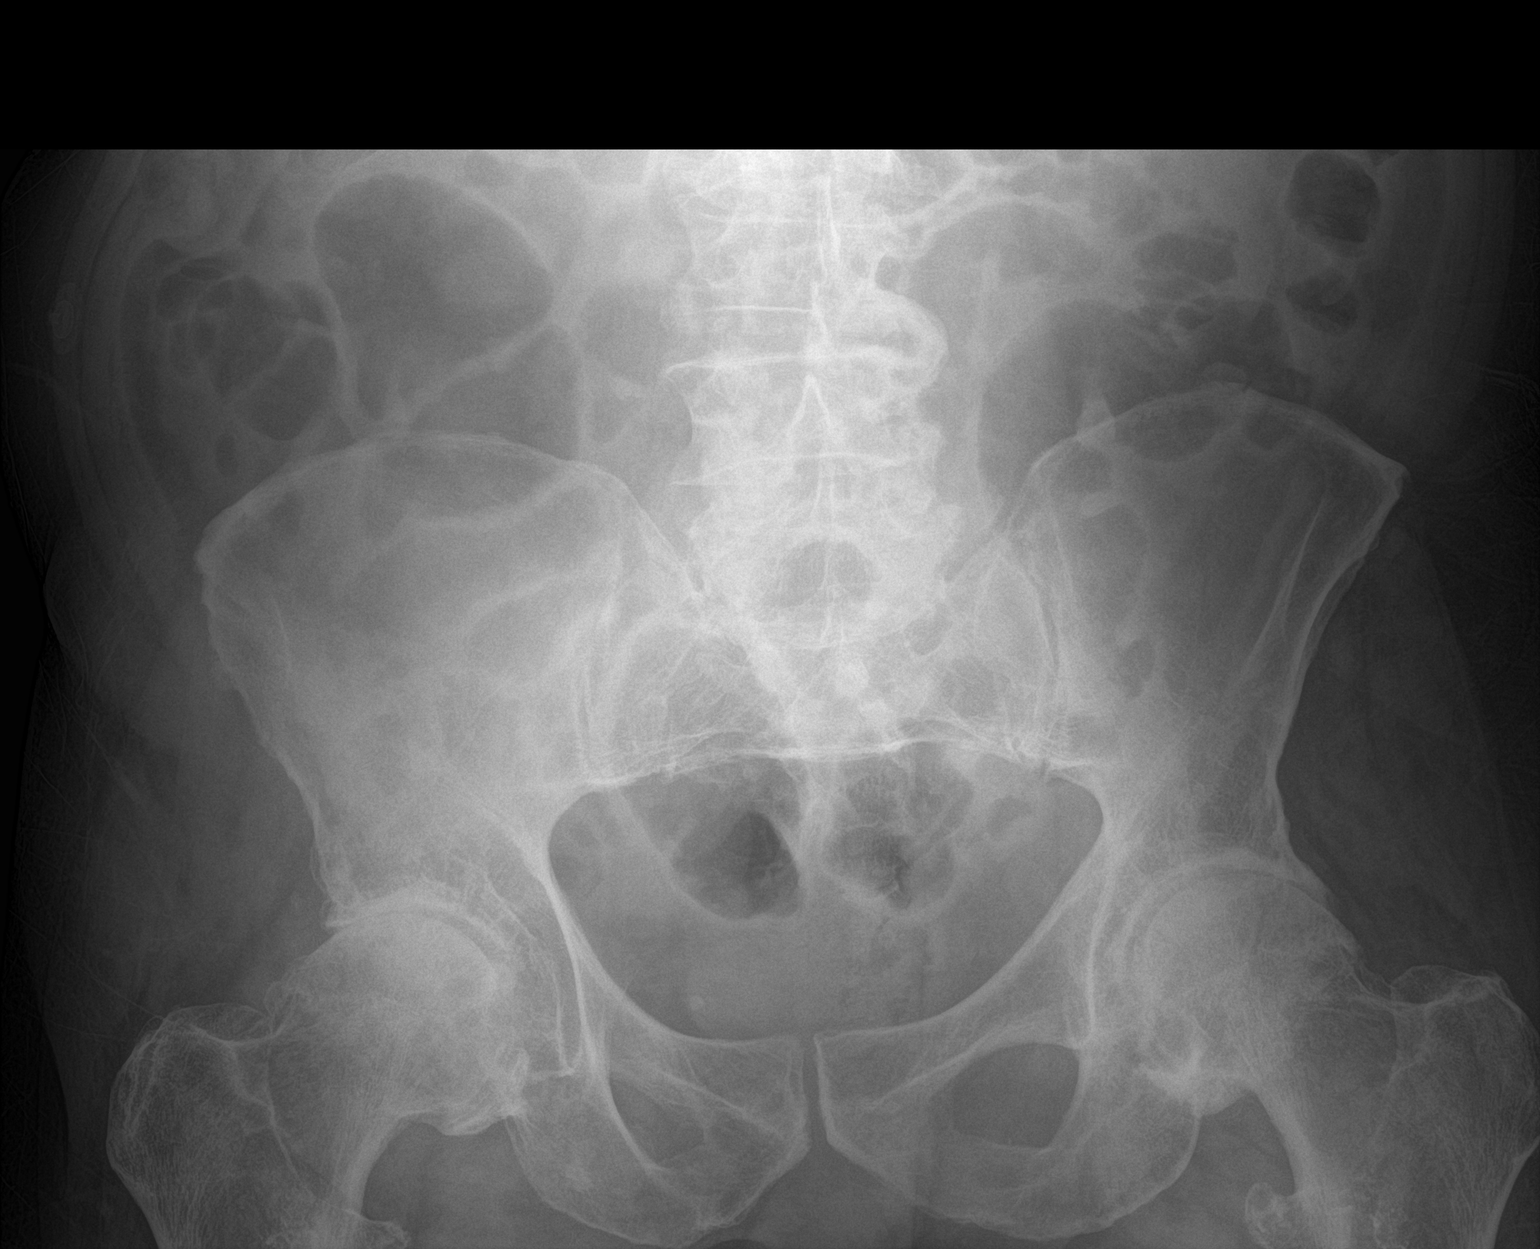

[1 of 1 positions shown; findings below may reference images not displayed]

FINDINGS: There is no evidence of displaced pelvic fracture or diastasis. No
pelvic bone lesions are seen. Severe bilateral femoroacetabular
arthrosis. Nonobstructive pattern of overlying bowel gas.
IMPRESSION: No displaced fracture of the pelvis or bilateral proximal femurs in
single frontal view. Please note that plain radiographs are
significantly insensitive for hip and pelvic fracture. Recommend CT
or MRI to more sensitively evaluate if there is high clinical
concern for fracture.

## 2023-02-13 DIAGNOSIS — I129 Hypertensive chronic kidney disease with stage 1 through stage 4 chronic kidney disease, or unspecified chronic kidney disease: Secondary | ICD-10-CM | POA: Diagnosis not present

## 2023-02-13 DIAGNOSIS — E785 Hyperlipidemia, unspecified: Secondary | ICD-10-CM | POA: Diagnosis not present

## 2023-02-13 DIAGNOSIS — E669 Obesity, unspecified: Secondary | ICD-10-CM | POA: Diagnosis not present

## 2023-02-13 DIAGNOSIS — F0394 Unspecified dementia, unspecified severity, with anxiety: Secondary | ICD-10-CM | POA: Diagnosis not present

## 2023-02-13 DIAGNOSIS — F319 Bipolar disorder, unspecified: Secondary | ICD-10-CM | POA: Diagnosis not present

## 2023-02-13 DIAGNOSIS — F429 Obsessive-compulsive disorder, unspecified: Secondary | ICD-10-CM | POA: Diagnosis not present

## 2023-02-13 DIAGNOSIS — R269 Unspecified abnormalities of gait and mobility: Secondary | ICD-10-CM | POA: Diagnosis not present

## 2023-02-13 DIAGNOSIS — G319 Degenerative disease of nervous system, unspecified: Secondary | ICD-10-CM | POA: Diagnosis not present

## 2023-02-13 DIAGNOSIS — I739 Peripheral vascular disease, unspecified: Secondary | ICD-10-CM | POA: Diagnosis not present

## 2023-03-10 ENCOUNTER — Ambulatory Visit: Payer: Medicare PPO | Admitting: Internal Medicine

## 2023-03-10 ENCOUNTER — Encounter: Payer: Self-pay | Admitting: Internal Medicine

## 2023-03-10 VITALS — BP 120/84 | HR 84 | Temp 98.1°F | Wt 252.0 lb

## 2023-03-10 DIAGNOSIS — F333 Major depressive disorder, recurrent, severe with psychotic symptoms: Secondary | ICD-10-CM | POA: Diagnosis not present

## 2023-03-10 DIAGNOSIS — R7989 Other specified abnormal findings of blood chemistry: Secondary | ICD-10-CM

## 2023-03-10 DIAGNOSIS — E785 Hyperlipidemia, unspecified: Secondary | ICD-10-CM

## 2023-03-10 DIAGNOSIS — I700 Atherosclerosis of aorta: Secondary | ICD-10-CM

## 2023-03-10 NOTE — Progress Notes (Signed)
Established Patient Office Visit     CC/Reason for Visit: Follow-up chronic conditions  HPI: Alexander Price is a 77 y.o. male who is coming in today for the above mentioned reasons. Past Medical History is significant for: Severe depression followed by psychiatry, hyperlipidemia, aortic atherosclerosis and a chronically elevated TSH that we have been following.  He is feeling well today without major concerns or complaints.   Past Medical/Surgical History: Past Medical History:  Diagnosis Date   Depression    sees Ellis Savage NP at [facility]    Hyperlipidemia     Past Surgical History:  Procedure Laterality Date   TONSILLECTOMY      Social History:  reports that he has never smoked. He has never used smokeless tobacco. He reports that he does not drink alcohol and does not use drugs.  Allergies: No Known Allergies  Family History:  Family History  Family history unknown: Yes     Current Outpatient Medications:    Cholecalciferol (VITAMIN D) 50 MCG (2000 UT) CAPS, Take by mouth., Disp: , Rfl:    donepezil (ARICEPT) 5 MG tablet, Take 10 mg by mouth at bedtime., Disp: , Rfl:    DULoxetine (CYMBALTA) 60 MG capsule, Take 90 mg by mouth in the morning., Disp: , Rfl:    lithium carbonate (ESKALITH) 450 MG CR tablet, Take 450 mg by mouth at bedtime. A total dose of 750mg  nightly, Disp: , Rfl:    lithium carbonate (LITHOBID) 300 MG CR tablet, Take 300 mg by mouth at bedtime. A total dose of 750mg  nightly, Disp: , Rfl:    Melatonin 10 MG TABS, Take by mouth., Disp: , Rfl:    methylphenidate (RITALIN) 10 MG tablet, Take 10 mg by mouth in the morning., Disp: , Rfl:    methylphenidate (RITALIN) 5 MG tablet, Take 5 mg by mouth. 1 tablet at lunchtime, Disp: , Rfl:    mirtazapine (REMERON) 30 MG tablet, Take 15 mg by mouth at bedtime., Disp: , Rfl:    Multiple Vitamin (MULTIVITAMIN WITH MINERALS) TABS tablet, Take 1 tablet by mouth daily., Disp: , Rfl:    OLANZapine  (ZYPREXA) 5 MG tablet, Take 2.5 mg by mouth at bedtime., Disp: , Rfl:    prazosin (MINIPRESS) 5 MG capsule, Take 5 mg by mouth at bedtime., Disp: , Rfl:    simvastatin (ZOCOR) 40 MG tablet, TAKE 1 TABLET BY MOUTH EVERYDAY AT BEDTIME, Disp: 90 tablet, Rfl: 0  Review of Systems:  Negative unless indicated in HPI.   Physical Exam: Vitals:   03/10/23 1514  BP: 120/84  Pulse: 84  Temp: 98.1 F (36.7 C)  TempSrc: Oral  SpO2: 95%  Weight: 252 lb (114.3 kg)    Body mass index is 34.18 kg/m.   Physical Exam Vitals reviewed.  Constitutional:      Appearance: Normal appearance.  HENT:     Head: Normocephalic and atraumatic.  Eyes:     Conjunctiva/sclera: Conjunctivae normal.     Pupils: Pupils are equal, round, and reactive to light.  Cardiovascular:     Rate and Rhythm: Normal rate and regular rhythm.  Pulmonary:     Effort: Pulmonary effort is normal.     Breath sounds: Normal breath sounds.  Skin:    General: Skin is warm and dry.  Neurological:     General: No focal deficit present.     Mental Status: He is alert and oriented to person, place, and time.  Psychiatric:  Mood and Affect: Mood normal.        Behavior: Behavior normal.        Thought Content: Thought content normal.        Judgment: Judgment normal.      Impression and Plan:  Hyperlipidemia, unspecified hyperlipidemia type -     Lipid panel; Future  Severe episode of recurrent major depressive disorder, with psychotic features (HCC) -     CBC with Differential/Platelet; Future -     Comprehensive metabolic panel; Future  Elevated TSH -     TSH; Future -     T4, free; Future -     T3, free; Future   -Continue simvastatin, check lipids, check thyroid function studies.  Continue follow-up with psychiatrist in regards to his depression.  Time spent:33 minutes reviewing chart, interviewing and examining patient and formulating plan of care.     Chaya Jan, MD [place] [facility] at [facility]

## 2023-03-11 LAB — CBC WITH DIFFERENTIAL/PLATELET
Basophils Absolute: 0 10*3/uL (ref 0.0–0.1)
Basophils Relative: 0.6 % (ref 0.0–3.0)
Eosinophils Absolute: 0.3 10*3/uL (ref 0.0–0.7)
Eosinophils Relative: 4 % (ref 0.0–5.0)
HCT: 42.6 % (ref 39.0–52.0)
Hemoglobin: 14.2 g/dL (ref 13.0–17.0)
Lymphocytes Relative: 17.4 % (ref 12.0–46.0)
Lymphs Abs: 1.2 10*3/uL (ref 0.7–4.0)
MCHC: 33.4 g/dL (ref 30.0–36.0)
MCV: 98.7 fL (ref 78.0–100.0)
Monocytes Absolute: 0.6 10*3/uL (ref 0.1–1.0)
Monocytes Relative: 8.5 % (ref 3.0–12.0)
Neutro Abs: 4.9 10*3/uL (ref 1.4–7.7)
Neutrophils Relative %: 69.5 % (ref 43.0–77.0)
Platelets: 315 10*3/uL (ref 150.0–400.0)
RBC: 4.31 Mil/uL (ref 4.22–5.81)
RDW: 12.8 % (ref 11.5–15.5)
WBC: 7 10*3/uL (ref 4.0–10.5)

## 2023-03-11 LAB — COMPREHENSIVE METABOLIC PANEL
ALT: 60 U/L — ABNORMAL HIGH (ref 0–53)
AST: 44 U/L — ABNORMAL HIGH (ref 0–37)
Albumin: 4.1 g/dL (ref 3.5–5.2)
Alkaline Phosphatase: 108 U/L (ref 39–117)
BUN: 20 mg/dL (ref 6–23)
CO2: 23 meq/L (ref 19–32)
Calcium: 9.2 mg/dL (ref 8.4–10.5)
Chloride: 106 meq/L (ref 96–112)
Creatinine, Ser: 1.26 mg/dL (ref 0.40–1.50)
GFR: 54.8 mL/min — ABNORMAL LOW (ref >60.00)
Glucose, Bld: 99 mg/dL (ref 70–99)
Potassium: 4.2 meq/L (ref 3.5–5.1)
Sodium: 140 meq/L (ref 135–145)
Total Bilirubin: 0.4 mg/dL (ref 0.2–1.2)
Total Protein: 7 g/dL (ref 6.0–8.3)

## 2023-03-11 LAB — LIPID PANEL
Cholesterol: 145 mg/dL (ref 0–200)
HDL: 37.4 mg/dL — ABNORMAL LOW (ref >39.00)
LDL Cholesterol: 44 mg/dL (ref 0–99)
NonHDL: 107.83
Total CHOL/HDL Ratio: 4
Triglycerides: 318 mg/dL — ABNORMAL HIGH (ref 0.0–149.0)
VLDL: 63.6 mg/dL — ABNORMAL HIGH (ref 0.0–40.0)

## 2023-03-11 LAB — TSH: TSH: 6.36 u[IU]/mL — ABNORMAL HIGH (ref 0.35–5.50)

## 2023-03-11 LAB — T3, FREE: T3, Free: 3.4 pg/mL (ref 2.3–4.2)

## 2023-03-11 LAB — T4, FREE: Free T4: 0.77 ng/dL (ref 0.60–1.60)

## 2023-03-12 ENCOUNTER — Encounter: Payer: Self-pay | Admitting: Internal Medicine

## 2023-03-12 ENCOUNTER — Other Ambulatory Visit: Payer: Self-pay | Admitting: Internal Medicine

## 2023-03-12 DIAGNOSIS — E039 Hypothyroidism, unspecified: Secondary | ICD-10-CM

## 2023-03-12 MED ORDER — LEVOTHYROXINE SODIUM 25 MCG PO TABS: 25.0000 ug | ORAL_TABLET | Freq: Every day | ORAL | 1 refills | Status: AC

## 2023-05-22 ENCOUNTER — Telehealth: Payer: Self-pay | Admitting: Internal Medicine

## 2023-05-22 ENCOUNTER — Emergency Department (HOSPITAL_COMMUNITY): Payer: Medicare HMO

## 2023-05-22 ENCOUNTER — Other Ambulatory Visit: Payer: Self-pay

## 2023-05-22 ENCOUNTER — Encounter (HOSPITAL_COMMUNITY): Payer: Self-pay | Admitting: Emergency Medicine

## 2023-05-22 ENCOUNTER — Inpatient Hospital Stay (HOSPITAL_COMMUNITY)
Admission: EM | Admit: 2023-05-22 | Discharge: 2023-05-28 | DRG: 057 | Disposition: A | Discharge status: Skilled Nursing Facility | Payer: Medicare HMO | Attending: Neurology | Admitting: Neurology

## 2023-05-22 DIAGNOSIS — E785 Hyperlipidemia, unspecified: Secondary | ICD-10-CM | POA: Diagnosis present

## 2023-05-22 DIAGNOSIS — F332 Major depressive disorder, recurrent severe without psychotic features: Secondary | ICD-10-CM | POA: Diagnosis present

## 2023-05-22 DIAGNOSIS — Z79899 Other long term (current) drug therapy: Secondary | ICD-10-CM | POA: Diagnosis not present

## 2023-05-22 DIAGNOSIS — Z6834 Body mass index (BMI) 34.0-34.9, adult: Secondary | ICD-10-CM | POA: Diagnosis not present

## 2023-05-22 DIAGNOSIS — T50995A Adverse effect of other drugs, medicaments and biological substances, initial encounter: Secondary | ICD-10-CM | POA: Diagnosis present

## 2023-05-22 DIAGNOSIS — I6389 Other cerebral infarction: Secondary | ICD-10-CM | POA: Diagnosis not present

## 2023-05-22 DIAGNOSIS — I6782 Cerebral ischemia: Secondary | ICD-10-CM | POA: Diagnosis not present

## 2023-05-22 DIAGNOSIS — K5900 Constipation, unspecified: Secondary | ICD-10-CM | POA: Diagnosis present

## 2023-05-22 DIAGNOSIS — G2111 Neuroleptic induced parkinsonism: Secondary | ICD-10-CM

## 2023-05-22 DIAGNOSIS — T43505A Adverse effect of unspecified antipsychotics and neuroleptics, initial encounter: Secondary | ICD-10-CM | POA: Diagnosis not present

## 2023-05-22 DIAGNOSIS — R262 Difficulty in walking, not elsewhere classified: Secondary | ICD-10-CM | POA: Diagnosis not present

## 2023-05-22 DIAGNOSIS — I6523 Occlusion and stenosis of bilateral carotid arteries: Secondary | ICD-10-CM | POA: Diagnosis not present

## 2023-05-22 DIAGNOSIS — Z66 Do not resuscitate: Secondary | ICD-10-CM | POA: Diagnosis not present

## 2023-05-22 DIAGNOSIS — I499 Cardiac arrhythmia, unspecified: Secondary | ICD-10-CM | POA: Diagnosis not present

## 2023-05-22 DIAGNOSIS — G319 Degenerative disease of nervous system, unspecified: Secondary | ICD-10-CM | POA: Diagnosis not present

## 2023-05-22 DIAGNOSIS — R29701 NIHSS score 1: Secondary | ICD-10-CM | POA: Diagnosis not present

## 2023-05-22 DIAGNOSIS — I10 Essential (primary) hypertension: Secondary | ICD-10-CM | POA: Diagnosis present

## 2023-05-22 DIAGNOSIS — R29704 NIHSS score 4: Secondary | ICD-10-CM | POA: Diagnosis not present

## 2023-05-22 DIAGNOSIS — R4701 Aphasia: Secondary | ICD-10-CM | POA: Diagnosis present

## 2023-05-22 DIAGNOSIS — Z9151 Personal history of suicidal behavior: Secondary | ICD-10-CM | POA: Diagnosis not present

## 2023-05-22 DIAGNOSIS — E669 Obesity, unspecified: Secondary | ICD-10-CM | POA: Diagnosis present

## 2023-05-22 DIAGNOSIS — R531 Weakness: Secondary | ICD-10-CM | POA: Diagnosis not present

## 2023-05-22 DIAGNOSIS — G2401 Drug induced subacute dyskinesia: Secondary | ICD-10-CM | POA: Diagnosis present

## 2023-05-22 DIAGNOSIS — G20C Parkinsonism, unspecified: Principal | ICD-10-CM | POA: Diagnosis present

## 2023-05-22 DIAGNOSIS — Z79890 Hormone replacement therapy (postmenopausal): Secondary | ICD-10-CM | POA: Diagnosis not present

## 2023-05-22 DIAGNOSIS — I671 Cerebral aneurysm, nonruptured: Secondary | ICD-10-CM | POA: Diagnosis not present

## 2023-05-22 DIAGNOSIS — G218 Other secondary parkinsonism: Secondary | ICD-10-CM | POA: Diagnosis not present

## 2023-05-22 DIAGNOSIS — R29707 NIHSS score 7: Secondary | ICD-10-CM | POA: Diagnosis present

## 2023-05-22 DIAGNOSIS — R29705 NIHSS score 5: Secondary | ICD-10-CM | POA: Diagnosis not present

## 2023-05-22 DIAGNOSIS — I639 Cerebral infarction, unspecified: Principal | ICD-10-CM | POA: Diagnosis present

## 2023-05-22 DIAGNOSIS — F4321 Adjustment disorder with depressed mood: Secondary | ICD-10-CM | POA: Diagnosis not present

## 2023-05-22 DIAGNOSIS — R278 Other lack of coordination: Secondary | ICD-10-CM | POA: Diagnosis not present

## 2023-05-22 DIAGNOSIS — E039 Hypothyroidism, unspecified: Secondary | ICD-10-CM | POA: Diagnosis not present

## 2023-05-22 DIAGNOSIS — R2681 Unsteadiness on feet: Secondary | ICD-10-CM | POA: Diagnosis not present

## 2023-05-22 DIAGNOSIS — Z7401 Bed confinement status: Secondary | ICD-10-CM | POA: Diagnosis not present

## 2023-05-22 DIAGNOSIS — M6281 Muscle weakness (generalized): Secondary | ICD-10-CM | POA: Diagnosis not present

## 2023-05-22 DIAGNOSIS — R41841 Cognitive communication deficit: Secondary | ICD-10-CM | POA: Diagnosis not present

## 2023-05-22 DIAGNOSIS — G459 Transient cerebral ischemic attack, unspecified: Secondary | ICD-10-CM | POA: Diagnosis not present

## 2023-05-22 DIAGNOSIS — E876 Hypokalemia: Secondary | ICD-10-CM | POA: Diagnosis present

## 2023-05-22 LAB — CBC
HCT: 41.8 % (ref 39.0–52.0)
Hemoglobin: 13.7 g/dL (ref 13.0–17.0)
MCH: 32.4 pg (ref 26.0–34.0)
MCHC: 32.8 g/dL (ref 30.0–36.0)
MCV: 98.8 fL (ref 80.0–100.0)
Platelets: 263 10*3/uL (ref 150–400)
RBC: 4.23 MIL/uL (ref 4.22–5.81)
RDW: 12.6 % (ref 11.5–15.5)
WBC: 7.9 10*3/uL (ref 4.0–10.5)
nRBC: 0 % (ref 0.0–0.2)

## 2023-05-22 LAB — DIFFERENTIAL
Abs Immature Granulocytes: 0.04 10*3/uL (ref 0.00–0.07)
Basophils Absolute: 0.1 10*3/uL (ref 0.0–0.1)
Basophils Relative: 1 %
Eosinophils Absolute: 0.2 10*3/uL (ref 0.0–0.5)
Eosinophils Relative: 3 %
Immature Granulocytes: 1 %
Lymphocytes Relative: 19 %
Lymphs Abs: 1.5 10*3/uL (ref 0.7–4.0)
Monocytes Absolute: 0.7 10*3/uL (ref 0.1–1.0)
Monocytes Relative: 8 %
Neutro Abs: 5.4 10*3/uL (ref 1.7–7.7)
Neutrophils Relative %: 68 %

## 2023-05-22 LAB — COMPREHENSIVE METABOLIC PANEL
ALT: 52 U/L — ABNORMAL HIGH (ref 0–44)
AST: 44 U/L — ABNORMAL HIGH (ref 15–41)
Albumin: 3.5 g/dL (ref 3.5–5.0)
Alkaline Phosphatase: 81 U/L (ref 38–126)
Anion gap: 12 (ref 5–15)
BUN: 17 mg/dL (ref 8–23)
CO2: 20 mmol/L — ABNORMAL LOW (ref 22–32)
Calcium: 9.1 mg/dL (ref 8.9–10.3)
Chloride: 107 mmol/L (ref 98–111)
Creatinine, Ser: 1.38 mg/dL — ABNORMAL HIGH (ref 0.61–1.24)
GFR, Estimated: 52 mL/min — ABNORMAL LOW (ref >60)
Glucose, Bld: 121 mg/dL — ABNORMAL HIGH (ref 70–99)
Potassium: 3.8 mmol/L (ref 3.5–5.1)
Sodium: 139 mmol/L (ref 135–145)
Total Bilirubin: 0.5 mg/dL (ref 0.0–1.2)
Total Protein: 7 g/dL (ref 6.5–8.1)

## 2023-05-22 LAB — APTT: aPTT: 29 s (ref 24–36)

## 2023-05-22 LAB — I-STAT CHEM 8, ED
BUN: 19 mg/dL (ref 8–23)
Calcium, Ion: 1.06 mmol/L — ABNORMAL LOW (ref 1.15–1.40)
Chloride: 111 mmol/L (ref 98–111)
Creatinine, Ser: 1.6 mg/dL — ABNORMAL HIGH (ref 0.61–1.24)
Glucose, Bld: 115 mg/dL — ABNORMAL HIGH (ref 70–99)
HCT: 41 % (ref 39.0–52.0)
Hemoglobin: 13.9 g/dL (ref 13.0–17.0)
Potassium: 3.8 mmol/L (ref 3.5–5.1)
Sodium: 139 mmol/L (ref 135–145)
TCO2: 21 mmol/L — ABNORMAL LOW (ref 22–32)

## 2023-05-22 LAB — PROTIME-INR
INR: 1.1 (ref 0.8–1.2)
Prothrombin Time: 14 s (ref 11.4–15.2)

## 2023-05-22 LAB — ETHANOL: Alcohol, Ethyl (B): <10 mg/dL (ref <10)

## 2023-05-22 LAB — HEMOGLOBIN A1C
Hgb A1c MFr Bld: 5.6 % (ref 4.8–5.6)
Mean Plasma Glucose: 114.02 mg/dL

## 2023-05-22 LAB — MRSA NEXT GEN BY PCR, NASAL: MRSA by PCR Next Gen: NOT DETECTED

## 2023-05-22 LAB — CBG MONITORING, ED: Glucose-Capillary: 116 mg/dL — ABNORMAL HIGH (ref 70–99)

## 2023-05-22 MED ORDER — STROKE: EARLY STAGES OF RECOVERY BOOK: Freq: Once | Status: AC

## 2023-05-22 MED ORDER — ACETAMINOPHEN 650 MG RE SUPP: 650.0000 mg | RECTAL | Status: DC | PRN

## 2023-05-22 MED ORDER — TENECTEPLASE FOR STROKE
25.0000 mg | PACK | Freq: Once | INTRAVENOUS | Status: AC
  Administered 2023-05-22: 25 mg via INTRAVENOUS
  Filled 2023-05-22: qty 10

## 2023-05-22 MED ORDER — ACETAMINOPHEN 160 MG/5ML PO SOLN: 650.0000 mg | ORAL | Status: DC | PRN

## 2023-05-22 MED ORDER — PANTOPRAZOLE SODIUM 40 MG IV SOLR
40.0000 mg | Freq: Every day | INTRAVENOUS | Status: DC
  Administered 2023-05-22: 40 mg via INTRAVENOUS
  Filled 2023-05-22: qty 10

## 2023-05-22 MED ORDER — CHLORHEXIDINE GLUCONATE CLOTH 2 % EX PADS
6.0000 | MEDICATED_PAD | Freq: Every day | CUTANEOUS | Status: DC
  Administered 2023-05-22 – 2023-05-28 (×5): 6 via TOPICAL

## 2023-05-22 MED ORDER — SENNOSIDES-DOCUSATE SODIUM 8.6-50 MG PO TABS: 1.0000 | ORAL_TABLET | Freq: Every evening | ORAL | Status: DC | PRN

## 2023-05-22 MED ORDER — ORAL CARE MOUTH RINSE: 15.0000 mL | OROMUCOSAL | Status: DC | PRN

## 2023-05-22 MED ORDER — IOHEXOL 350 MG/ML SOLN
75.0000 mL | Freq: Once | INTRAVENOUS | Status: AC | PRN
  Administered 2023-05-22: 75 mL via INTRAVENOUS

## 2023-05-22 MED ORDER — ACETAMINOPHEN 325 MG PO TABS: 650.0000 mg | ORAL_TABLET | ORAL | Status: DC | PRN

## 2023-05-22 NOTE — Telephone Encounter (Signed)
 Patient's son Lorin Picket dropped off document FL2, to be filled out by provider. Patient's son requested to send it back via Call pt's son Lorin Picket, to pick up within ASAP. Document is located in providers tray at front office.Please advise at Mobile [phone]

## 2023-05-22 NOTE — Progress Notes (Signed)
 PHARMACIST CODE STROKE RESPONSE  Notified to mix TNK at 1928 by Dr. Derry Lory TNK preparation completed at 1931  BP 114/96 mmHg before administration  TNK dose = 25 mg IV over 5 seconds.   Issues/delays encountered (if applicable): consent  Alexander Price 05/22/23 7:37 PM

## 2023-05-22 NOTE — H&P (Signed)
 NEUROLOGY H&P NOTE   Date of service: May 22, 2023 Patient Name: Alexander Price MRN:  [id number] DOB:  [birth year] Chief Complaint: "Stroke code"  History of Present Illness  Alexander Price is a 78 y.o. male with hx of HLD, major depression, action tremors, who lives in an assisted living and brought in as a code stroke for leaning on his left, slurred speech and concern for left sided extinction and aphasia.  I spoke with his son who was with patient the entire day and dropped him back at his facility at 1745 and patient was at his baseline. Patient was then noted to have some left sided weakness. He was only using his R hand to eat, confused. Facility called EMS. He was brought in as a code stroke.  He was leaning to his left on arrival but strength was intact. He favor right over left and makes eye contact more on the right than left. I spoke with his son an daughter about concern for possible stroke and discussed tnkase. However, the extinction seemed to have improved but patient now noted to have aphasia. Can identify 3/6 objects. Gets stuck on words when conversing. I had a repeat discussion with son and daughter and they consented to tnkase.  Patient is not on anticoagulation, no known hx of major surgery or trauma recently despite using walker. No chest pain.  Last known well: 1800 Modified rankin score: 3-Moderate disability-requires help but walks WITHOUT assistance IV Thrombolysis: Yes, discussed about 30% chance of improvement in symptoms and a 3-5% risk of ICH that could also worsen his symptoms and cause death. Son and daughter consented to tnkase administration citing that word finding difficulty would result in significant decline in the quality of his life and patient would have wanted tnkase. Thrombectomy: Not offered, no LVO. NIHSS components Score: Comment  1a Level of Conscious 0[]  1[]  2[]  3[]      1b LOC Questions 0[]  1[]  2[]       1c LOC Commands 0[]  1[]  2[]       2  Best Gaze 0[]  1[]  2[]       3 Visual 0[]  1[]  2[]  3[]      4 Facial Palsy 0[]  1[]  2[]  3[]      5a Motor Arm - left 0[]  1[]  2[]  3[]  4[]  UN[]    5b Motor Arm - Right 0[]  1[]  2[]  3[]  4[]  UN[]    6a Motor Leg - Left 0[]  1[]  2[]  3[]  4[]  UN[]    6b Motor Leg - Right 0[]  1[]  2[]  3[]  4[]  UN[]    7 Limb Ataxia 0[]  1[]  2[]  3[]  UN[]     8 Sensory 0[]  1[]  2[]  UN[]      9 Best Language 0[]  1[]  2[]  3[]      10 Dysarthria 0[]  1[]  2[]  UN[]      11 Extinct. and Inattention 0[]  1[]  2[]       TOTAL:       ROS  ***Comprehensive ROS performed and pertinent positives documented in the HPI ***Unable to perform due to***  Past History   Past Medical History:  Diagnosis Date   Depression    sees Ellis Savage NP at [facility]    Hyperlipidemia    Past Surgical History:  Procedure Laterality Date   TONSILLECTOMY     Family History  Family history unknown: Yes   Social History   Socioeconomic History   Marital status: Divorced    Spouse name: Not on file   Number of children: Not on file   Years of education:  Not on file   Highest education level: Not on file  Occupational History   Not on file  Tobacco Use   Smoking status: Never   Smokeless tobacco: Never  Vaping Use   Vaping status: Never Used  Substance and Sexual Activity   Alcohol use: Never   Drug use: Never   Sexual activity: Not on file  Other Topics Concern   Not on file  Social History Narrative   Not on file   Social Drivers of Health   Financial Resource Strain: Low Risk  (05/05/2018)   Received from [facility], [facility]   Overall Financial Resource Strain (CARDIA)    Difficulty of Paying Living Expenses: Not very hard  Food Insecurity: No Food Insecurity (05/05/2018)   Received from [facility], [facility]   Hunger Vital Sign    Worried About Running Out of Food in the Last Year: Never true    Ran Out of Food in the Last Year: Never true  Transportation Needs: No Transportation Needs (05/05/2018)    Received from [facility], [facility]   [facility] - Transportation    Lack of Transportation (Medical): No    Lack of Transportation (Non-Medical): No  Physical Activity: Insufficiently Active (05/05/2018)   Received from [facility], [facility]   Exercise Vital Sign    Days of Exercise per Week: 2 days    Minutes of Exercise per Session: 30 min  Stress: No Stress Concern Present (05/05/2018)   Received from [facility] - Occupational Stress Questionnaire    Feeling of Stress : Only a little  Social Connections: Moderately Isolated (05/05/2018)   Received from [facility], [facility]   Social Connection and Isolation Panel [NHANES]    Frequency of Communication with Friends and Family: Once a week    Frequency of Social Gatherings with Friends and Family: Once a week    Attends Religious Services: 1 to 4 times per year    Active Member of [employer] or Organizations: No    Attends Engineer, structural: Never    Marital Status: Divorced   No Known Allergies  Medications   Current Facility-Administered Medications:    tenecteplase (TNKASE) injection for Stroke 25 mg, 25 mg, Intravenous, Once, Erick Blinks, MD  Current Outpatient Medications:    Cholecalciferol (VITAMIN D) 50 MCG (2000 UT) CAPS, Take by mouth., Disp: , Rfl:    donepezil (ARICEPT) 5 MG tablet, Take 10 mg by mouth at bedtime., Disp: , Rfl:    DULoxetine (CYMBALTA) 60 MG capsule, Take 90 mg by mouth in the morning., Disp: , Rfl:    levothyroxine (SYNTHROID) 25 MCG tablet, Take 1 tablet (25 mcg total) by mouth daily., Disp: 90 tablet, Rfl: 1   lithium carbonate (ESKALITH) 450 MG CR tablet, Take 450 mg by mouth at bedtime. A total dose of 750mg  nightly, Disp: , Rfl:    lithium carbonate (LITHOBID) 300 MG CR tablet, Take 300 mg by mouth at bedtime. A total dose of 750mg  nightly, Disp: , Rfl:    Melatonin 10 MG TABS, Take by  mouth., Disp: , Rfl:    methylphenidate (RITALIN) 10 MG tablet, Take 10 mg by mouth in the morning., Disp: , Rfl:    methylphenidate (RITALIN) 5 MG tablet, Take 5 mg by mouth. 1 tablet at lunchtime, Disp: , Rfl:    mirtazapine (REMERON) 30 MG  tablet, Take 15 mg by mouth at bedtime., Disp: , Rfl:    Multiple Vitamin (MULTIVITAMIN WITH MINERALS) TABS tablet, Take 1 tablet by mouth daily., Disp: , Rfl:    OLANZapine (ZYPREXA) 5 MG tablet, Take 2.5 mg by mouth at bedtime., Disp: , Rfl:    prazosin (MINIPRESS) 5 MG capsule, Take 5 mg by mouth at bedtime., Disp: , Rfl:    simvastatin (ZOCOR) 40 MG tablet, TAKE 1 TABLET BY MOUTH EVERYDAY AT BEDTIME, Disp: 90 tablet, Rfl: 0   Vitals   Vitals:   05/22/23 1914  Weight: 115 kg     Body mass index is 34.38 kg/m.  Physical Exam   Constitutional: Appears well-developed and well-nourished. *** Psych: Affect appropriate to situation. *** Eyes: No scleral injection. *** HENT: No OP obstruction. *** Head: Normocephalic. *** Cardiovascular: Normal rate and regular rhythm. *** Respiratory: Effort normal, non-labored breathing. *** GI: Soft.  No distension. There is no tenderness. *** Skin: WDI. ***  Neurologic Examination   ***  Labs   CBC:  Recent Labs  Lab 05/22/23 1909 05/22/23 1927  WBC 7.9  --   NEUTROABS 5.4  --   HGB 13.7 13.9  HCT 41.8 41.0  MCV 98.8  --   PLT 263  --    Basic Metabolic Panel:  Lab Results  Component Value Date   NA 139 05/22/2023   K 3.8 05/22/2023   CO2 23 03/10/2023   GLUCOSE 115 (H) 05/22/2023   BUN 19 05/22/2023   CREATININE 1.60 (H) 05/22/2023   CALCIUM 9.2 03/10/2023   GFRNONAA >60 08/04/2021   Lipid Panel:  Lab Results  Component Value Date   LDLCALC 44 03/10/2023   HgbA1c:  Lab Results  Component Value Date   HGBA1C 5.1 02/09/2019   Urine Drug Screen:     Component Value Date/Time   LABOPIA NONE DETECTED 08/05/2021 0251   COCAINSCRNUR NONE DETECTED 08/05/2021 0251   LABBENZ  NONE DETECTED 08/05/2021 0251   AMPHETMU NONE DETECTED 08/05/2021 0251   THCU NONE DETECTED 08/05/2021 0251   LABBARB NONE DETECTED 08/05/2021 0251    Alcohol Level No results found for: "ETH" INR  Lab Results  Component Value Date   INR 1.1 05/22/2023   APTT No results found for: "APTT"   CT Head without contrast(Personally reviewed): ***  CT angio Head and Neck with contrast(Personally reviewed): ***  MR Angio head without contrast and Carotid Duplex BL(Personally reviewed): ***  MRI Brain(Personally reviewed): ***  rEEG:  ***  Assessment   Alexander Price is a 78 y.o. male ***  Primary Diagnosis:  {Cerebral Infarction:22351}  Secondary Diagnosis: {Stroke Comorbidities:21266}  Recommendations  *** ______________________________________________________________________   Signed, Erick Blinks, MD [facility]  Risks, benefits and alternatives of IVT discussed with patient and/or family and they agreed.*** CTH personally reviewed prior to TNK administration***

## 2023-05-22 NOTE — ED Triage Notes (Signed)
 Patient bib ems from [place] as code stroke. LKW 6pm. Symptoms discovered 6:15pm. Friends of patient report patient was having coordination issues when trying to eat dinner. EMS noticed left side neglect. 18g LAC.

## 2023-05-22 NOTE — ED Provider Notes (Signed)
 [place] EMERGENCY DEPARTMENT AT [facility] Provider Note   CSN: [id number] Arrival date & time: 05/22/23  1907  An emergency department physician performed an initial assessment on this suspected stroke patient at 63.  History  Chief Complaint  Patient presents with   Code Stroke    Alexander Price is a 78 y.o. male.  78 year old male with history of hyperlipidemia and depression who presents emergency department with neglect and difficulty with coordination.  Patient was with several other individuals at 6 PM when he was acting normally.  At approximately 6:15 PM today they noticed that he had some difficulty feeding himself and with coordination.  Also noticed that he was staring to the right more.  Not on anticoagulation.       Home Medications Prior to Admission medications   Medication Sig Start Date End Date Taking? Authorizing Provider  Cholecalciferol (VITAMIN D) 50 MCG (2000 UT) CAPS Take 1 capsule by mouth in the morning.   Yes [provider]  donepezil (ARICEPT) 5 MG tablet Take 10 mg by mouth at bedtime.   Yes [provider]  DULoxetine (CYMBALTA) 60 MG capsule Take 90 mg by mouth in the morning.   Yes [provider]  levothyroxine (SYNTHROID) 25 MCG tablet Take 1 tablet (25 mcg total) by mouth daily. Patient taking differently: Take 25 mcg by mouth in the morning. 03/12/23  Yes Philip Aspen, Limmie Patricia, MD  lithium carbonate (ESKALITH) 450 MG CR tablet Take 450 mg by mouth at bedtime. A total dose of 750mg  nightly   Yes [provider]  lithium carbonate (LITHOBID) 300 MG CR tablet Take 300 mg by mouth at bedtime. A total dose of 750mg  nightly   Yes [provider]  Melatonin 10 MG TABS Take 1 tablet by mouth at bedtime.   Yes [provider]  methylphenidate (RITALIN) 10 MG tablet Take 10 mg by mouth in the morning.   Yes [provider]  methylphenidate (RITALIN) 5 MG tablet Take 5 mg  by mouth daily as needed. 1 tablet at lunchtime   Yes [provider]  mirtazapine (REMERON) 30 MG tablet Take 15 mg by mouth at bedtime.   Yes [provider]  OLANZapine (ZYPREXA) 5 MG tablet Take 2.5 mg by mouth at bedtime.   Yes [provider]  prazosin (MINIPRESS) 5 MG capsule Take 5 mg by mouth at bedtime.   Yes [provider]      Allergies    Patient has no known allergies.    Review of Systems   Review of Systems  Physical Exam Updated Vital Signs BP 121/74   Pulse 67   Temp 98.5 F (36.9 C) (Oral)   Resp 16   Ht 6' (1.829 m)   Wt 115 kg   SpO2 93%   BMI 34.38 kg/m  Physical Exam Vitals and nursing note reviewed.  Constitutional:      General: He is not in acute distress.    Appearance: He is well-developed.  HENT:     Head: Normocephalic and atraumatic.     Right Ear: External ear normal.     Left Ear: External ear normal.     Nose: Nose normal.  Eyes:     Extraocular Movements: Extraocular movements intact.     Conjunctiva/sclera: Conjunctivae normal.     Pupils: Pupils are equal, round, and reactive to light.  Cardiovascular:     Rate and Rhythm: Normal rate and regular rhythm.  Heart sounds: Normal heart sounds.  Pulmonary:     Effort: Pulmonary effort is normal. No respiratory distress.     Breath sounds: Normal breath sounds.  Musculoskeletal:     Cervical back: Normal range of motion and neck supple.     Right lower leg: No edema.     Left lower leg: No edema.  Skin:    General: Skin is warm and dry.  Neurological:     Mental Status: He is alert.     Comments: NIHSS Exam  Level of Consciousness: Alert  LOC Questions: Answers age correctly.  Thought the month was March. LOC Commands: Opens and Closes Eyes and Hands on command  Best Gaze: Horizontal ocular movements intact  Visual Fields: Unable to assess Facial Palsy: None  L Upper Extremity Motor: No drift after 10 seconds  R Upper Extremity Motor: No  drift after 10 seconds  L Lower extremity Motor: Initially with drift but on repeat exam did not have any drift after 5 seconds R Lower extremity Motor: No drift after 5 seconds  Ataxia: Unable to assess Sensory: Intact sensation to light touch on face, arms, trunk, and legs bilaterally  Best Language: Aphasia present Dysarthria: Mild dysarthria Neglect: Left-sided visual neglect  Psychiatric:        Mood and Affect: Mood normal.        Behavior: Behavior normal.     ED Results / Procedures / Treatments   Labs (all labs ordered are listed, but only abnormal results are displayed) Labs Reviewed  COMPREHENSIVE METABOLIC PANEL - Abnormal; Notable for the following components:      Result Value   CO2 20 (*)    Glucose, Bld 121 (*)    Creatinine, Ser 1.38 (*)    AST 44 (*)    ALT 52 (*)    GFR, Estimated 52 (*)    All other components within normal limits  LIPID PANEL - Abnormal; Notable for the following components:   Cholesterol 201 (*)    Triglycerides 152 (*)    HDL 35 (*)    LDL Cholesterol 136 (*)    All other components within normal limits  I-STAT CHEM 8, ED - Abnormal; Notable for the following components:   Creatinine, Ser 1.60 (*)    Glucose, Bld 115 (*)    Calcium, Ion 1.06 (*)    TCO2 21 (*)    All other components within normal limits  CBG MONITORING, ED - Abnormal; Notable for the following components:   Glucose-Capillary 116 (*)    All other components within normal limits  MRSA NEXT GEN BY PCR, NASAL  PROTIME-INR  APTT  CBC  DIFFERENTIAL  ETHANOL  PROTIME-INR  APTT  HEMOGLOBIN A1C    EKG EKG Interpretation Date/Time:  Thursday May 22 2023 19:47:14 EST Ventricular Rate:  90 PR Interval:  188 QRS Duration:  87 QT Interval:  366 QTC Calculation: 448 R Axis:   79  Text Interpretation: Sinus arrhythmia Confirmed by Kommor, Madison ([id number]) on 05/23/2023 11:41:50 AM  Radiology ECHOCARDIOGRAM COMPLETE Result Date: 05/23/2023    ECHOCARDIOGRAM  REPORT   Patient Name:   Alexander Price Date of Exam: 05/23/2023 Medical Rec #:  [id number]       Height:       72.0 in Accession #:    [id number]      Weight:       253.5 lb Date of Birth:  15-Oct-1945      BSA:  2.357 m Patient Age:    77 years        BP:           140/68 mmHg Patient Gender: M               HR:           75 bpm. Exam Location:  Inpatient Procedure: 2D Echo, Cardiac Doppler, Color Doppler and Intracardiac            Opacification Agent (Both Spectral and Color Flow Doppler were            utilized during procedure). Indications:    Stroke  History:        Patient has no prior history of Echocardiogram examinations.                 Risk Factors:HLD.  Sonographer:    Webb Laws Referring Phys: [id number] SALMAN KHALIQDINA IMPRESSIONS  1. Left ventricular ejection fraction, by estimation, is 60 to 65%. The left ventricle has normal function. The left ventricle has no regional wall motion abnormalities. Left ventricular diastolic parameters are consistent with Grade I diastolic dysfunction (impaired relaxation).  2. Right ventricular systolic function is normal. The right ventricular size is normal. Tricuspid regurgitation signal is inadequate for assessing PA pressure.  3. The mitral valve is normal in structure. No evidence of mitral valve regurgitation. No evidence of mitral stenosis.  4. The aortic valve is tricuspid. There is mild calcification of the aortic valve. Aortic valve regurgitation is not visualized. No aortic stenosis is present.  5. IVC not visualized. FINDINGS  Left Ventricle: Left ventricular ejection fraction, by estimation, is 60 to 65%. The left ventricle has normal function. The left ventricle has no regional wall motion abnormalities. The left ventricular internal cavity size was normal in size. There is  no left ventricular hypertrophy. Left ventricular diastolic parameters are consistent with Grade I diastolic dysfunction (impaired relaxation). Right Ventricle: The  right ventricular size is normal. No increase in right ventricular wall thickness. Right ventricular systolic function is normal. Tricuspid regurgitation signal is inadequate for assessing PA pressure. Left Atrium: Left atrial size was normal in size. Right Atrium: Right atrial size was normal in size. Pericardium: There is no evidence of pericardial effusion. Mitral Valve: The mitral valve is normal in structure. No evidence of mitral valve regurgitation. No evidence of mitral valve stenosis. Tricuspid Valve: The tricuspid valve is normal in structure. Tricuspid valve regurgitation is trivial. Aortic Valve: The aortic valve is tricuspid. There is mild calcification of the aortic valve. Aortic valve regurgitation is not visualized. No aortic stenosis is present. Pulmonic Valve: The pulmonic valve was not well visualized. Pulmonic valve regurgitation is not visualized. Aorta: The aortic root is normal in size and structure. Venous: IVC not visualized. The inferior vena cava was not well visualized. IAS/Shunts: No atrial level shunt detected by color flow Doppler.  LEFT VENTRICLE PLAX 2D LVIDd:         4.50 cm   Diastology LVIDs:         2.50 cm   LV e' medial:    6.84 cm/s LV PW:         0.90 cm   LV E/e' medial:  9.6 LV IVS:        1.00 cm   LV e' lateral:   11.50 cm/s LVOT diam:     2.10 cm   LV E/e' lateral: 5.7 LV SV:  58 LV SV Index:   25 LVOT Area:     3.46 cm  RIGHT VENTRICLE RV Basal diam:  4.10 cm RV S prime:     19.70 cm/s TAPSE (M-mode): 3.0 cm LEFT ATRIUM           Index       RIGHT ATRIUM           Index LA diam:      2.50 cm 1.06 cm/m  RA Area:     14.50 cm LA Vol (A4C): 19.6 ml 8.32 ml/m  RA Volume:   30.20 ml  12.82 ml/m  AORTIC VALVE LVOT Vmax:   107.00 cm/s LVOT Vmean:  70.300 cm/s LVOT VTI:    0.168 m  AORTA Ao Root diam: 3.10 cm MITRAL VALVE MV Area (PHT): 3.60 cm    SHUNTS MV Decel Time: 211 msec    Systemic VTI:  0.17 m MV E velocity: 65.50 cm/s  Systemic Diam: 2.10 cm MV A  velocity: 80.00 cm/s MV E/A ratio:  0.82 Dalton McleanMD Electronically signed by Wilfred Lacy Signature Date/Time: 05/23/2023/12:45:34 PM    Final    CT ANGIO HEAD NECK W WO CM (CODE STROKE) Result Date: 05/22/2023 CLINICAL DATA:  Initial evaluation for acute neuro deficit, stroke. EXAM: CT ANGIOGRAPHY HEAD AND NECK WITH AND WITHOUT CONTRAST TECHNIQUE: Multidetector CT imaging of the head and neck was performed using the standard protocol during bolus administration of intravenous contrast. Multiplanar CT image reconstructions and MIPs were obtained to evaluate the vascular anatomy. Carotid stenosis measurements (when applicable) are obtained utilizing NASCET criteria, using the distal internal carotid diameter as the denominator. RADIATION DOSE REDUCTION: This exam was performed according to the departmental dose-optimization program which includes automated exposure control, adjustment of the mA and/or kV according to patient size and/or use of iterative reconstruction technique. CONTRAST:  75mL OMNIPAQUE IOHEXOL 350 MG/ML SOLN COMPARISON:  CT from earlier the same day. FINDINGS: CTA NECK FINDINGS Aortic arch: Standard branching. Imaged portion shows no evidence of aneurysm or dissection. No significant stenosis of the major arch vessel origins. Right carotid system: Right common and internal carotid arteries are patent without dissection. Mild atheromatous change about the right carotid bulb without hemodynamically significant stenosis. Left carotid system: Left common and internal carotid arteries are patent without dissection. Mild atheromatous irregularity about the left carotid bulb without hemodynamically significant stenosis. Vertebral arteries: Both vertebral arteries arise from subclavian arteries. Vertebral arteries are patent without stenosis or dissection. Skeleton: No discrete or worrisome osseous lesions. Prominent osteoarthritic changes noted about the C1-2 articulations. Underlying cervical  spondylosis, most pronounced at C4-5 through C6-7. Other neck: No other acute finding. Upper chest: Scattered atelectatic changes noted within the visualized lungs. 7 mm pleural base nodule at the posterior right upper lobe (series 5, image 18), indeterminate. Review of the MIP images confirms the above findings CTA HEAD FINDINGS Anterior circulation: Both internal carotid arteries are patent to the termini without stenosis. 4 mm saccular aneurysm seen extending posteriorly and medially from the cavernous left ICA (series 7, image 287). A1 segments patent bilaterally. Normal anterior communicating artery complex. Anterior cerebral arteries patent without stenosis. No M1 stenosis or occlusion. Distal MCA branches perfused and symmetric. Posterior circulation: Both V4 segments patent without stenosis. Both PICA patent. Basilar patent without stenosis. Superior cerebral arteries patent bilaterally. Left PCA supplied via the basilar. Predominant fetal type origin of the right PCA. Both PCAs patent without stenosis. Venous sinuses: Patent allowing for timing the contrast bolus right  transverse sinus is markedly hypoplastic. Anatomic variants: As above. Review of the MIP images confirms the above findings IMPRESSION: 1. Negative CTA for large vessel occlusion or other emergent finding. 2. Mild atheromatous change about the carotid bifurcations without hemodynamically significant stenosis. 3. 4 mm cavernous left ICA aneurysm. 4. 7 mm pleural based nodule at the posterior right upper lobe, indeterminate. Per Fleischner Society Guidelines, recommend a non-contrast Chest CT at 6-12 months. If patient is high risk for malignancy, consider an additional non-contrast Chest CT at 18-24 months. If patient is low risk for malignancy, non-contrast Chest CT at 18-24 months is optional. These guidelines do not apply to immunocompromised patients and patients with cancer. Follow up in patients with significant comorbidities as  clinically warranted. For lung cancer screening, adhere to Lung-RADS guidelines. Reference: Radiology. 2017; 284(1):228-43. Case discussed by telephone at the time of interpretation on 05/22/2023 at 7:43 pm to provider [facility]. Electronically Signed   By: Rise Mu M.D.   On: 05/22/2023 19:44   CT HEAD CODE STROKE WO CONTRAST Result Date: 05/22/2023 CLINICAL DATA:  Code stroke. Initial evaluation for acute neuro deficit, stroke. EXAM: CT HEAD WITHOUT CONTRAST TECHNIQUE: Contiguous axial images were obtained from the base of the skull through the vertex without intravenous contrast. RADIATION DOSE REDUCTION: This exam was performed according to the departmental dose-optimization program which includes automated exposure control, adjustment of the mA and/or kV according to patient size and/or use of iterative reconstruction technique. COMPARISON:  MRI from 08/05/2021 FINDINGS: Brain: Cerebral volume within normal limits for age. Mild chronic microvascular ischemic disease. No acute intracranial hemorrhage. No acute large vessel territory infarct. No mass lesion or midline shift. No hydrocephalus or extra-axial fluid collection. Vascular: No abnormal hyperdense vessel. Scattered vascular calcifications noted within the carotid siphons. Skull: Scalp soft tissues and calvarium within normal limits. Sinuses/Orbits: Globes orbital soft tissues within normal limits. Paranasal sinuses are largely clear. Large left mastoid and middle ear effusion. Trace right mastoid effusion. Other: None. ASPECTS [facility] Stroke Program Early CT Score) - Ganglionic level infarction (caudate, lentiform nuclei, internal capsule, insula, M1-M3 cortex): 7 - Supraganglionic infarction (M4-M6 cortex): 3 Total score (0-10 with 10 being normal): 10 IMPRESSION: 1. No acute intracranial abnormality. 2. ASPECTS is 10. 3. Mild chronic microvascular ischemic disease. 4. Large left mastoid and middle ear effusion, of uncertain  significance. Correlation with physical exam recommended. These results were communicated to Dr. Derry Lory at 7:25 pm on 05/22/2023 by text page via the [facility] messaging system. Electronically Signed   By: Rise Mu M.D.   On: 05/22/2023 19:26    Procedures Procedures    Medications Ordered in ED Medications   stroke: early stages of recovery book (has no administration in time range)  acetaminophen (TYLENOL) tablet 650 mg (has no administration in time range)    Or  acetaminophen (TYLENOL) 160 MG/5ML solution 650 mg (has no administration in time range)    Or  acetaminophen (TYLENOL) suppository 650 mg (has no administration in time range)  senna-docusate (Senokot-S) tablet 1 tablet (has no administration in time range)  Chlorhexidine Gluconate Cloth 2 % PADS 6 each (6 each Topical Given 05/22/23 2152)  Oral care mouth rinse (has no administration in time range)  perflutren lipid microspheres (DEFINITY) IV suspension (1 mL Intravenous Given 05/23/23 1223)  atorvastatin (LIPITOR) tablet 40 mg (has no administration in time range)  levothyroxine (SYNTHROID) tablet 25 mcg (has no administration in time range)  donepezil (ARICEPT) tablet 10 mg (has no administration in  time range)  cholecalciferol (VITAMIN D3) 25 MCG (1000 UNIT) tablet 2,000 Units (has no administration in time range)  DULoxetine (CYMBALTA) DR capsule 90 mg (has no administration in time range)  prazosin (MINIPRESS) capsule 5 mg (has no administration in time range)  iohexol (OMNIPAQUE) 350 MG/ML injection 75 mL (75 mLs Intravenous Contrast Given 05/22/23 1928)  tenecteplase (TNKASE) injection for Stroke 25 mg (25 mg Intravenous Given 05/22/23 1936)    ED Course/ Medical Decision Making/ A&P Clinical Course as of 05/23/23 1429  Thu May 22, 2023  2012 Dr Derry Lory to admit [RP]  2028 DNR confirmed with daughter who has brought his DNR form.  [RP]    Clinical Course User Index [RP] Rondel Baton, MD                                  Medical Decision Making Amount and/or Complexity of Data Reviewed Labs: ordered. Radiology: ordered.  Risk Decision regarding hospitalization.   Alexander Price is a 78 y.o. male with comorbidities that complicate the patient evaluation including hyperlipidemia and depression who presents emergency department with neglect and difficulty with coordination.    Initial Ddx:  Stroke, ICH, hypoglycemia, meningitis/infection  MDM:  Concerned about a stroke given the patient's symptoms.  Patient is protecting airway at this time.  Is within the window for tPA with code stroke being activated in the field.  Will check a CBG to ensure that the patient is not hypoglycemic and a CT of the head to evaluate for ICH.  No signs of infection currently on history or exam to suggest an infectious cause of their symptoms.  Plan:  CBG Labs Code stroke activation CT head  ED Summary/Re-evaluation:  Head CT without acute findings.  Was given TNK.  CTA without LVO.  Patient had improvement of his symptoms upon my repeat examination.  Was no longer having neglect.  His speech seemed to improve along with his weakness as well.  Was admitted to neuro ICU for further management.  This patient presents to the ED for concern of complaints listed in HPI, this involves an extensive number of treatment options, and is a complaint that carries with it a high risk of complications and morbidity. Disposition including potential need for admission considered.   Dispo: ICU  Additional history obtained from daughter Records reviewed Outpatient Clinic Notes The following labs were independently interpreted: Chemistry and show CKD I independently reviewed the following imaging with scope of interpretation limited to determining acute life threatening conditions related to emergency care: CT Head and agree with the radiologist interpretation with the following exceptions: none I personally  reviewed and interpreted the pt's EKG: see above for interpretation  I have reviewed the patients home medications and made adjustments as needed Consults: Neurology Social Determinants of health:  Elderly  CRITICAL CARE Performed by: Rondel Baton   Total critical care time: 30 minutes  Critical care time was exclusive of separately billable procedures and treating other patients.  Critical care was necessary to treat or prevent imminent or life-threatening deterioration.  Critical care was time spent personally by me on the following activities: development of treatment plan with patient and/or surrogate as well as nursing, discussions with consultants, evaluation of patient's response to treatment, examination of patient, obtaining history from patient or surrogate, ordering and performing treatments and interventions, ordering and review of laboratory studies, ordering and review of radiographic studies, pulse  oximetry and re-evaluation of patient's condition.   Final Clinical Impression(s) / ED Diagnoses Final diagnoses:  Cerebrovascular accident (CVA), unspecified mechanism (HCC)    Rx / DC Orders ED Discharge Orders     None         Rondel Baton, MD 05/23/23 1429

## 2023-05-22 NOTE — Code Documentation (Signed)
 Stroke Response Nurse Documentation Code Documentation  Onofre Gains is a 78 y.o. male arriving to [facility]  via [facility] on 05/22/23 with past medical hx of HLD and depression. On No antithrombotic. Code stroke was activated by EMS.   Patient from assisted living where he was LKW at 1800 and now complaining of left sided weakness and slurred speech. Pt was with son all day and returned to assisted living around 1745 where he was found to be confused and favoring his R side, EMS was called.  Stroke team at the bedside on patient arrival. Labs drawn and patient cleared for CT. Patient to CT with team. NIHSS 7, see documentation for details and code stroke times. The following imaging was completed:  CT Head and CTA. Patient is a candidate for IV Thrombolytic. Patient is not a candidate for IR due to no LVO. TNK given at 1736  Care Plan: q71min NIH and vitals x2h then q37min x6hrs, then q1h x16hr. BP goals <180/105  Bedside handoff with ED RN Delorise Shiner.    Mordecai Rasmussen  Stroke Response RN

## 2023-05-23 ENCOUNTER — Inpatient Hospital Stay (HOSPITAL_COMMUNITY): Payer: Medicare HMO

## 2023-05-23 DIAGNOSIS — I6389 Other cerebral infarction: Secondary | ICD-10-CM

## 2023-05-23 DIAGNOSIS — G2111 Neuroleptic induced parkinsonism: Secondary | ICD-10-CM | POA: Diagnosis not present

## 2023-05-23 DIAGNOSIS — T43505A Adverse effect of unspecified antipsychotics and neuroleptics, initial encounter: Secondary | ICD-10-CM | POA: Diagnosis not present

## 2023-05-23 DIAGNOSIS — I639 Cerebral infarction, unspecified: Secondary | ICD-10-CM | POA: Diagnosis not present

## 2023-05-23 DIAGNOSIS — R29704 NIHSS score 4: Secondary | ICD-10-CM

## 2023-05-23 LAB — ECHOCARDIOGRAM COMPLETE
Area-P 1/2: 3.6 cm2
Height: 72 in
S' Lateral: 2.5 cm
Weight: 4056.46 [oz_av]

## 2023-05-23 LAB — LIPID PANEL
Cholesterol: 201 mg/dL — ABNORMAL HIGH (ref 0–200)
HDL: 35 mg/dL — ABNORMAL LOW (ref >40)
LDL Cholesterol: 136 mg/dL — ABNORMAL HIGH (ref 0–99)
Total CHOL/HDL Ratio: 5.7 {ratio}
Triglycerides: 152 mg/dL — ABNORMAL HIGH (ref <150)
VLDL: 30 mg/dL (ref 0–40)

## 2023-05-23 MED ORDER — PRAZOSIN HCL 2 MG PO CAPS
5.0000 mg | ORAL_CAPSULE | Freq: Every day | ORAL | Status: DC
  Administered 2023-05-23 – 2023-05-27 (×5): 5 mg via ORAL
  Filled 2023-05-23 (×6): qty 1

## 2023-05-23 MED ORDER — LEVOTHYROXINE SODIUM 25 MCG PO TABS
25.0000 ug | ORAL_TABLET | Freq: Every day | ORAL | Status: DC
  Administered 2023-05-23 – 2023-05-28 (×6): 25 ug via ORAL
  Filled 2023-05-23 (×6): qty 1

## 2023-05-23 MED ORDER — VITAMIN D 25 MCG (1000 UNIT) PO TABS
2000.0000 [IU] | ORAL_TABLET | Freq: Every morning | ORAL | Status: DC
  Administered 2023-05-24 – 2023-05-28 (×5): 2000 [IU] via ORAL
  Filled 2023-05-23 (×6): qty 2

## 2023-05-23 MED ORDER — ATORVASTATIN CALCIUM 40 MG PO TABS
40.0000 mg | ORAL_TABLET | Freq: Every day | ORAL | Status: DC
  Administered 2023-05-23 – 2023-05-28 (×6): 40 mg via ORAL
  Filled 2023-05-23 (×6): qty 1

## 2023-05-23 MED ORDER — DONEPEZIL HCL 10 MG PO TABS
10.0000 mg | ORAL_TABLET | Freq: Every day | ORAL | Status: DC
  Administered 2023-05-23 – 2023-05-27 (×5): 10 mg via ORAL
  Filled 2023-05-23 (×5): qty 1

## 2023-05-23 MED ORDER — PERFLUTREN LIPID MICROSPHERE
1.0000 mL | INTRAVENOUS | Status: AC | PRN
  Administered 2023-05-23: 1 mL via INTRAVENOUS

## 2023-05-23 MED ORDER — DULOXETINE HCL 60 MG PO CPEP
90.0000 mg | ORAL_CAPSULE | Freq: Every morning | ORAL | Status: DC
  Administered 2023-05-24 – 2023-05-28 (×5): 90 mg via ORAL
  Filled 2023-05-23 (×5): qty 1

## 2023-05-23 NOTE — Progress Notes (Addendum)
 STROKE TEAM PROGRESS NOTE    SIGNIFICANT HOSPITAL EVENTS  TNK given 2/27 @ 1931  INTERIM HISTORY/SUBJECTIVE  Daughter at bedside. States patient has had trouble with words and some neuro decline for around 4 months, says he's had possible old mini strokes. Has been walking with walker with shuffling gait. Family was preparing to move him into ALF. Confirmed extensive psych history with multiple psych meds including lithium for 3 years, psych consulted.  Oriented to age, place, not to year or month. Tardive dyskinesia present, baseline for years per daughter. But assessment looks more like resting tremors. ? Possibility for secondary parkinson's, he has not seen neurologist for 3 years per daughter   OBJECTIVE  CBC    Component Value Date/Time   WBC 7.9 05/22/2023 1909   RBC 4.23 05/22/2023 1909   HGB 13.9 05/22/2023 1927   HCT 41.0 05/22/2023 1927   PLT 263 05/22/2023 1909   MCV 98.8 05/22/2023 1909   MCH 32.4 05/22/2023 1909   MCHC 32.8 05/22/2023 1909   RDW 12.6 05/22/2023 1909   LYMPHSABS 1.5 05/22/2023 1909   MONOABS 0.7 05/22/2023 1909   EOSABS 0.2 05/22/2023 1909   BASOSABS 0.1 05/22/2023 1909    BMET    Component Value Date/Time   NA 139 05/22/2023 1927   NA 140 11/30/2020 0000   K 3.8 05/22/2023 1927   CL 111 05/22/2023 1927   CO2 20 (L) 05/22/2023 1909   GLUCOSE 115 (H) 05/22/2023 1927   BUN 19 05/22/2023 1927   BUN 15 11/30/2020 0000   CREATININE 1.60 (H) 05/22/2023 1927   CALCIUM 9.1 05/22/2023 1909   EGFR 60.0 12/13/2022 0822   GFRNONAA 52 (L) 05/22/2023 1909    IMAGING past 24 hours CT ANGIO HEAD NECK W WO CM (CODE STROKE) Result Date: 05/22/2023 CLINICAL DATA:  Initial evaluation for acute neuro deficit, stroke. EXAM: CT ANGIOGRAPHY HEAD AND NECK WITH AND WITHOUT CONTRAST TECHNIQUE: Multidetector CT imaging of the head and neck was performed using the standard protocol during bolus administration of intravenous contrast. Multiplanar CT image  reconstructions and MIPs were obtained to evaluate the vascular anatomy. Carotid stenosis measurements (when applicable) are obtained utilizing NASCET criteria, using the distal internal carotid diameter as the denominator. RADIATION DOSE REDUCTION: This exam was performed according to the departmental dose-optimization program which includes automated exposure control, adjustment of the mA and/or kV according to patient size and/or use of iterative reconstruction technique. CONTRAST:  75mL OMNIPAQUE IOHEXOL 350 MG/ML SOLN COMPARISON:  CT from earlier the same day. FINDINGS: CTA NECK FINDINGS Aortic arch: Standard branching. Imaged portion shows no evidence of aneurysm or dissection. No significant stenosis of the major arch vessel origins. Right carotid system: Right common and internal carotid arteries are patent without dissection. Mild atheromatous change about the right carotid bulb without hemodynamically significant stenosis. Left carotid system: Left common and internal carotid arteries are patent without dissection. Mild atheromatous irregularity about the left carotid bulb without hemodynamically significant stenosis. Vertebral arteries: Both vertebral arteries arise from subclavian arteries. Vertebral arteries are patent without stenosis or dissection. Skeleton: No discrete or worrisome osseous lesions. Prominent osteoarthritic changes noted about the C1-2 articulations. Underlying cervical spondylosis, most pronounced at C4-5 through C6-7. Other neck: No other acute finding. Upper chest: Scattered atelectatic changes noted within the visualized lungs. 7 mm pleural base nodule at the posterior right upper lobe (series 5, image 18), indeterminate. Review of the MIP images confirms the above findings CTA HEAD FINDINGS Anterior circulation: Both internal carotid  arteries are patent to the termini without stenosis. 4 mm saccular aneurysm seen extending posteriorly and medially from the cavernous left ICA  (series 7, image 287). A1 segments patent bilaterally. Normal anterior communicating artery complex. Anterior cerebral arteries patent without stenosis. No M1 stenosis or occlusion. Distal MCA branches perfused and symmetric. Posterior circulation: Both V4 segments patent without stenosis. Both PICA patent. Basilar patent without stenosis. Superior cerebral arteries patent bilaterally. Left PCA supplied via the basilar. Predominant fetal type origin of the right PCA. Both PCAs patent without stenosis. Venous sinuses: Patent allowing for timing the contrast bolus right transverse sinus is markedly hypoplastic. Anatomic variants: As above. Review of the MIP images confirms the above findings IMPRESSION: 1. Negative CTA for large vessel occlusion or other emergent finding. 2. Mild atheromatous change about the carotid bifurcations without hemodynamically significant stenosis. 3. 4 mm cavernous left ICA aneurysm. 4. 7 mm pleural based nodule at the posterior right upper lobe, indeterminate. Per Fleischner Society Guidelines, recommend a non-contrast Chest CT at 6-12 months. If patient is high risk for malignancy, consider an additional non-contrast Chest CT at 18-24 months. If patient is low risk for malignancy, non-contrast Chest CT at 18-24 months is optional. These guidelines do not apply to immunocompromised patients and patients with cancer. Follow up in patients with significant comorbidities as clinically warranted. For lung cancer screening, adhere to Lung-RADS guidelines. Reference: Radiology. 2017; 284(1):228-43. Case discussed by telephone at the time of interpretation on 05/22/2023 at 7:43 pm to provider [facility] Electronically Signed   By: Rise Mu M.D.   On: 05/22/2023 19:44   CT HEAD CODE STROKE WO CONTRAST Result Date: 05/22/2023 CLINICAL DATA:  Code stroke. Initial evaluation for acute neuro deficit, stroke. EXAM: CT HEAD WITHOUT CONTRAST TECHNIQUE: Contiguous axial images were  obtained from the base of the skull through the vertex without intravenous contrast. RADIATION DOSE REDUCTION: This exam was performed according to the departmental dose-optimization program which includes automated exposure control, adjustment of the mA and/or kV according to patient size and/or use of iterative reconstruction technique. COMPARISON:  MRI from 08/05/2021 FINDINGS: Brain: Cerebral volume within normal limits for age. Mild chronic microvascular ischemic disease. No acute intracranial hemorrhage. No acute large vessel territory infarct. No mass lesion or midline shift. No hydrocephalus or extra-axial fluid collection. Vascular: No abnormal hyperdense vessel. Scattered vascular calcifications noted within the carotid siphons. Skull: Scalp soft tissues and calvarium within normal limits. Sinuses/Orbits: Globes orbital soft tissues within normal limits. Paranasal sinuses are largely clear. Large left mastoid and middle ear effusion. Trace right mastoid effusion. Other: None. ASPECTS [facility] Stroke Program Early CT Score) - Ganglionic level infarction (caudate, lentiform nuclei, internal capsule, insula, M1-M3 cortex): 7 - Supraganglionic infarction (M4-M6 cortex): 3 Total score (0-10 with 10 being normal): 10 IMPRESSION: 1. No acute intracranial abnormality. 2. ASPECTS is 10. 3. Mild chronic microvascular ischemic disease. 4. Large left mastoid and middle ear effusion, of uncertain significance. Correlation with physical exam recommended. These results were communicated to Dr. Derry Lory at 7:25 pm on 05/22/2023 by text page via the [facility] messaging system. Electronically Signed   By: Rise Mu M.D.   On: 05/22/2023 19:26    Vitals:   05/23/23 0902 05/23/23 1025 05/23/23 1030 05/23/23 1200  BP: (!) 165/98 (!) 131/94 97/60   Pulse: 91 90 74   Resp: (!) 29 17 10    Temp:    98.5 F (36.9 C)  TempSrc:    Oral  SpO2: 93% 91% 95%  Weight:      Height:         PHYSICAL EXAM General:   Alert, well-nourished, well-developed patient in no acute distress Psych:  Mood and affect appropriate for situation, calm and cooperative CV: Regular rate and rhythm on monitor Respiratory:  Regular, unlabored respirations on room air GI: Abdomen soft and nontender  NEURO:  Mental Status/Speech: Patient is oriented to self, age, place.  He is not oriented to year or month.  He does have slowed responses with some repetition/perseveration.  Patient is able to tell what happened yesterday with some repetition and aphasia present.  Cranial Nerves:  II: PERRL. Visual fields full.  III, IV, VI: Decreased EOM to left, but consistent blink to threat. Slowed EOM bilaterally.  V: Sensation is intact to light touch and symmetrical to face.  VII: Face is symmetrical resting and smiling VIII: hearing intact to voice. IX, X: Palate elevates symmetrically. Phonation is normal.  WU:[id number] shrug 5/5. XII: tongue is midline without fasciculations. Motor: 5/5 strength to all muscle groups tested.  Bradykinesia present no drift present Tardive dyskinesia present, baseline for years per daughter. But assessment looks more like resting tremors. Possibility for secondary parkinson's. Tone: is normal and bulk is normal Sensation-variable responses to sensation exam.   Coordination: FTN intact bilaterally, HKS: no ataxia in BLE.No drift.  Gait- deferred  Most Recent NIH: 4    ASSESSMENT/PLAN  Alexander Price is a 78 y.o. male with hx of HLD, major depression, action tremors, who lives in an assisted living and brought in as a code stroke for leaning on his left, slurred speech and concern for left sided extinction and aphasia   Stroke like symptoms s/p TNK  Code Stroke CT head negative for a large hypodensity concerning for a large territory infarct or hyperdensity concerning for an ICH  CTA head & neck No LVO MRI: PENDING 2D Echo EF 60-65% LDL 136 HgbA1c 5.6 VTE prophylaxis - SCDs while pending  24hour post-TNK imaging No antithrombotic prior to admission, now on No antithrombotic pending 24hour post-TNK imaging.  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   Hypertension Home meds:  prasozin 5mg  at bedtime Stable BP goal < 180/105 post TNK [place] term BP goal normotensive  Hyperlipidemia Home meds:  none LDL 136, goal < 70 Add Lipitor  40mg  Continue statin at discharge  Other Stroke Risk Factors Obesity, Body mass index is 34.38 kg/m., BMI >/= 30 associated with increased stroke risk, recommend weight loss, diet and exercise as appropriate   Other Active Problems Hx of Depression, Extensive Psych History Home meds: Ritalin, Remeron, Lithium, Zyprexa Psych consult for assistance with med mgmt Hypokalemia, supplemented and will continue to monitor Cognitive impairment, decline for the last 4 months, especially last 2 months. On Aricept  Hospital day # 1   Pt seen by Neuro NP/APP and later by MD. Note/plan to be edited by MD as needed.    Lynnae January, DNP, AGACNP-BC [facility] Please use AMION for contact information & EPIC for messaging.  ATTENDING NOTE: I reviewed above note and agree with the assessment  and plan. Pt was seen and examined.   Daughter is at the bedside. Pt awake, alert, lying in bed, eyes open, orientated to age, place, and people, however, not orientated to time. Intermittent word finding difficulty but per daughter, this has been going on for the last 4 months, probably today a  little worse than baseline. Mild dysarthria, however, follows all simple commands. Able to name and repeat but mild perseveration. Significant psychomotor slowing. Eye midline, incomplete with bilateral gaze, and slight with request. Visual field is full, PERRL. No significant facial droop but moderate masked face expression. Tongue midline. bilateral UEs 4/5, no drift. Bilaterally LEs proximal 3/5 and distally 3/5. Significant bradykinesia and mild rigidity. Sensation symmetrical bilaterally, no sensory neglect. b/l FTN intact but slow, with intermittent resting tremor bilaterally, R.L. Gait not tested but per daughter pt at ALF walk with walker but shuffling gait and stooped posturing.   Pt neuro exam concerning for parkinsonism secondary to [place] term psych meds, will consult psych to see need of med adjustment. Pending MRI. On diet. Add statin. Holding DAPT until 24h post TNK. PT and OT recommend SNF.   For detailed assessment and plan, please refer to above/below as I have made changes wherever appropriate.   Marvel Plan, MD PhD Stroke Neurology 05/23/2023 1:28 PM  This patient is critically ill due to stroke like symptoms post TNK, parkinsonism and at significant risk of neurological worsening, death form stroke, hemorrhagic conversion, bleeding from TNK, falls. This patient's care requires constant monitoring of vital signs, hemodynamics, respiratory and cardiac monitoring, review of multiple databases, neurological assessment, discussion with family, other specialists and medical decision making of high complexity. I spent 45 minutes of neurocritical care time in the care of this patient. I had [place] discussion with daughter at bedside, updated pt current condition, treatment plan and potential prognosis, and answered all the questions. She expressed understanding and appreciation.       To contact Stroke Continuity provider, please refer to [email or web address]. After hours, contact General Neurology

## 2023-05-23 NOTE — Evaluation (Signed)
 Speech Language Pathology Evaluation Patient Details Name: Alexander Price MRN: [id number] DOB: 11/07/1945 Today's Date: 05/23/2023 Time: 4098-1191 SLP Time Calculation (min) (ACUTE ONLY): 33 min  Problem List:  Patient Active Problem List   Diagnosis Date Noted   Stroke determined by clinical assessment (HCC) 05/22/2023   Hypothyroidism (acquired) 12/06/2021   Non-traumatic rhabdomyolysis    COVID-19 virus infection 08/04/2021   Hyperlipidemia    Depression    Past Medical History:  Past Medical History:  Diagnosis Date   Depression    sees Alexander Savage NP at [facility]    Hyperlipidemia    Past Surgical History:  Past Surgical History:  Procedure Laterality Date   TONSILLECTOMY     HPI:  78 yo male lives ALF ([place]) presented with left sided weakness and slurred speech. Initial imaging with no evidence of stroke. Underwent TNK. 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   Assessment / Plan / Recommendation Clinical Impression  Alexander Price presents with mild dysfluency and word-retrieval deficits, consistent with baseline communication per his daughter, Alexander Price, who was at the bedside.  Dysarthric speech has improved since initial presentation- now also c/w baseline. Pt acknowledges feeling "mixed-up" - he was disoriented to elements of time, had difficulty with placement of numbers during clock-drawing, and needed cues to attend to the left. He had difficulty following multistep commands.  Tests of confrontational and responsive naming and word discrimination were [facility].  Recommend brief f/u with SLP while admitted to address the aforementioned acute deficits. Alexander Price and Alexander Price agree with plan.  Alexander Price notes that [name] was going to transition into AL at the time of this admitting event.  Anticipate that communication/cognition are near baseline.    SLP Assessment  SLP  Recommendation/Assessment: Patient needs continued Speech Lanaguage Pathology Services SLP Visit Diagnosis: Cognitive communication deficit (R41.841)    Recommendations for follow up therapy are one component of a multi-disciplinary discharge planning process, led by the attending physician.  Recommendations may be updated based on patient status, additional functional criteria and insurance authorization.    Follow Up Recommendations  Other (comment) (tba)    Assistance Recommended at Discharge   intermittent  Functional Status Assessment    Frequency and Duration min 1 x/week  1 week      SLP Evaluation Cognition  Overall Cognitive Status: Impaired/Different from baseline Arousal/Alertness: Awake/alert Orientation Level: Oriented to person;Oriented to place;Disoriented to time;Oriented to situation Attention: Sustained Sustained Attention: Appears intact Memory: Appears intact       Comprehension  Auditory Comprehension Overall Auditory Comprehension: Impaired Yes/No Questions: Within Functional Limits Commands: Impaired Multistep Basic Commands: 50-74% accurate Conversation: Simple Visual Recognition/Discrimination Discrimination: Within Function Limits Reading Comprehension Reading Status: Not tested    Expression Expression Primary Mode of Expression: Verbal Verbal Expression Overall Verbal Expression: Impaired at baseline Initiation: No impairment Automatic Speech: Social Response;Counting;Day of week Level of Generative/Spontaneous Verbalization: Conversation Repetition: No impairment Naming: Impairment Responsive: 76-100% accurate Confrontation: Within functional limits Written Expression Dominant Hand: Left   Oral / Motor  Oral Motor/Sensory Function Overall Oral Motor/Sensory Function: Mild impairment Facial Symmetry: Abnormal symmetry left;Suspected CN VII (facial) dysfunction Motor Speech Overall Motor Speech: Impaired at baseline Respiration:  Within functional limits Phonation: Normal Resonance: Within functional limits Articulation:  (Mild misarticulations at baseline) Intelligibility: Intelligible Motor Planning: Witnin functional limits            Blenda Mounts Alexander Price 05/23/2023, 1:11 PM  Alexander Price L. Samson Frederic, MA CCC/SLP Clinical Specialist - Acute Care SLP Acute Rehabilitation Services Office number [phone]

## 2023-05-23 NOTE — [place] FL2 (Signed)
 [place] MEDICAID FL2 LEVEL OF CARE FORM     IDENTIFICATION  Patient Name: Alexander Price Birthdate: 1945/06/30 Sex: male Admission Date (Current Location): 05/22/2023  [facility] and [place] Number:  Producer, television/film/video and Address:  [facility] [place], [address], [place], [place] [address]      Provider Number: [id number]  Attending Physician Name and Address:  Stroke, Md, MD  Relative Name and Phone Number:       Current Level of Care: Hospital Recommended Level of Care: Skilled Nursing Facility Prior Approval Number:    Date Approved/Denied:   PASRR Number: [id number]  Discharge Plan: SNF    Current Diagnoses: Patient Active Problem List   Diagnosis Date Noted   Stroke determined by clinical assessment (HCC) 05/22/2023   Hypothyroidism (acquired) 12/06/2021   Non-traumatic rhabdomyolysis    COVID-19 virus infection 08/04/2021   Hyperlipidemia    Depression     Orientation RESPIRATION BLADDER Height & Weight     Self, Situation, Place  Normal Incontinent, External catheter Weight: 253 lb 8.5 oz (115 kg) Height:  6' (182.9 cm)  BEHAVIORAL SYMPTOMS/MOOD NEUROLOGICAL BOWEL NUTRITION STATUS      Continent Diet (See dc summary)  AMBULATORY STATUS COMMUNICATION OF NEEDS Skin   Extensive Assist Verbally Normal                       Personal Care Assistance Level of Assistance  Bathing, Feeding, Dressing Bathing Assistance: Maximum assistance Feeding assistance: Limited assistance Dressing Assistance: Maximum assistance     Functional Limitations Info  Sight, Hearing Sight Info: Impaired (Glasses) Hearing Info: Impaired      SPECIAL CARE FACTORS FREQUENCY  PT (By licensed PT), OT (By licensed OT)     PT Frequency: 5x/week OT Frequency: 5x/week            Contractures Contractures Info: Not present    Additional Factors Info  Code Status, Allergies, Psychotropic Code Status Info: DNR Allergies Info:  NKA Psychotropic Info: see dc summary         Current Medications (05/23/2023):  This is the current hospital active medication list Current Facility-Administered Medications  Medication Dose Route Frequency Provider Last Rate Last Admin    stroke: early stages of recovery book   Does not apply Once Erick Blinks, MD       acetaminophen (TYLENOL) tablet 650 mg  650 mg Oral Q4H PRN Erick Blinks, MD       Or   acetaminophen (TYLENOL) 160 MG/5ML solution 650 mg  650 mg Per Tube Q4H PRN Erick Blinks, MD       Or   acetaminophen (TYLENOL) suppository 650 mg  650 mg Rectal Q4H PRN Erick Blinks, MD       atorvastatin (LIPITOR) tablet 40 mg  40 mg Oral Daily Hetty Blend C, NP   40 mg at 05/23/23 1430   Chlorhexidine Gluconate Cloth 2 % PADS 6 each  6 each Topical Q0600 Erick Blinks, MD   6 each at 05/22/23 2152   [START ON 05/24/2023] cholecalciferol (VITAMIN D3) 25 MCG (1000 UNIT) tablet 2,000 Units  2,000 Units Oral q AM Marvel Plan, MD       donepezil (ARICEPT) tablet 10 mg  10 mg Oral QHS Hetty Blend C, NP       [START ON 05/24/2023] DULoxetine (CYMBALTA) DR capsule 90 mg  90 mg Oral q AM Marvel Plan, MD       levothyroxine (SYNTHROID)  tablet 25 mcg  25 mcg Oral Q0600 Hetty Blend C, NP   25 mcg at 05/23/23 1455   Oral care mouth rinse  15 mL Mouth Rinse PRN Erick Blinks, MD       prazosin (MINIPRESS) capsule 5 mg  5 mg Oral QHS Marvel Plan, MD       senna-docusate (Senokot-S) tablet 1 tablet  1 tablet Oral QHS PRN Erick Blinks, MD         Discharge Medications: Please see discharge summary for a list of discharge medications.  Relevant Imaging Results:  Relevant Lab Results:   Additional Information SSN: [id number] [place], [place]

## 2023-05-23 NOTE — Plan of Care (Signed)
 Patient able to work with PT, OT and speech today. Transferred to chair with +2 assist, tolerated well. MRI performed, see notes. Patient and family updated in plan of care. ICU status maintained.   Problem: Education: Goal: Knowledge of disease or condition will improve Outcome: Progressing Goal: Knowledge of secondary prevention will improve (MUST DOCUMENT ALL) Outcome: Progressing Goal: Knowledge of patient specific risk factors will improve (DELETE if not current risk factor) Outcome: Progressing   Problem: Ischemic Stroke/TIA Tissue Perfusion: Goal: Complications of ischemic stroke/TIA will be minimized Outcome: Progressing   Problem: Coping: Goal: Will verbalize positive feelings about self Outcome: Progressing Goal: Will identify appropriate support needs Outcome: Progressing   Problem: Health Behavior/Discharge Planning: Goal: Ability to manage health-related needs will improve Outcome: Progressing Goal: Goals will be collaboratively established with patient/family Outcome: Progressing   Problem: Self-Care: Goal: Ability to participate in self-care as condition permits will improve Outcome: Progressing Goal: Verbalization of feelings and concerns over difficulty with self-care will improve Outcome: Progressing Goal: Ability to communicate needs accurately will improve Outcome: Progressing   Problem: Nutrition: Goal: Risk of aspiration will decrease Outcome: Progressing Goal: Dietary intake will improve Outcome: Progressing   Problem: Education: Goal: Knowledge of General Education information will improve Description: Including pain rating scale, medication(s)/side effects and non-pharmacologic comfort measures Outcome: Progressing   Problem: Health Behavior/Discharge Planning: Goal: Ability to manage health-related needs will improve Outcome: Progressing   Problem: Clinical Measurements: Goal: Ability to maintain clinical measurements within normal limits  will improve Outcome: Progressing Goal: Will remain free from infection Outcome: Progressing Goal: Diagnostic test results will improve Outcome: Progressing Goal: Respiratory complications will improve Outcome: Progressing Goal: Cardiovascular complication will be avoided Outcome: Progressing   Problem: Activity: Goal: Risk for activity intolerance will decrease Outcome: Progressing   Problem: Nutrition: Goal: Adequate nutrition will be maintained Outcome: Progressing   Problem: Coping: Goal: Level of anxiety will decrease Outcome: Progressing   Problem: Elimination: Goal: Will not experience complications related to bowel motility Outcome: Progressing Goal: Will not experience complications related to urinary retention Outcome: Progressing   Problem: Pain Managment: Goal: General experience of comfort will improve and/or be controlled Outcome: Progressing   Problem: Safety: Goal: Ability to remain free from injury will improve Outcome: Progressing   Problem: Skin Integrity: Goal: Risk for impaired skin integrity will decrease Outcome: Progressing

## 2023-05-23 NOTE — Evaluation (Addendum)
 Physical Therapy Evaluation Patient Details Name: Alexander Price MRN: [id number] DOB: 1945-10-31 Today's Date: 05/23/2023  History of Present Illness  78 yo male lives ALF ([facility]) L side weakness and slurred speech. NIHSS 7 PMH HLD major depression action tremors  Clinical Impression  Patient presents with decreased mobility due to decreased strength, decreased balance, decreased activity tolerance and high risk for falls.  Patient previously at ILF but in process of transitioning to ALF prior to admission due to difficulty with self care and increasingly difficulty with ambulation.  Patient needing mod A +2 at times for mobility in the room.  PT will continue to follow while in the acute setting.  Recommend post-acute inpatient rehab (<3 hours/day) at d/c.         If plan is discharge home, recommend the following: A lot of help with walking and/or transfers;A lot of help with bathing/dressing/bathroom;Assist for transportation;Assistance with cooking/housework;Help with stairs or ramp for entrance   Can travel by private vehicle   Yes    Equipment Recommendations None recommended by PT  Recommendations for Other Services       Functional Status Assessment Patient has had a recent decline in their functional status and demonstrates the ability to make significant improvements in function in a reasonable and predictable amount of time.     Precautions / Restrictions Precautions Precautions: Fall      Mobility  Bed Mobility Overal bed mobility: Needs Assistance Bed Mobility: Rolling, Supine to Sit Rolling: Contact guard assist   Supine to sit: Min assist     General bed mobility comments: HOB 45 and min (A) to elevate trunk from surface    Transfers Overall transfer level: Needs assistance Equipment used: Rolling walker (2 wheels) Transfers: Sit to/from Stand, Bed to chair/wheelchair/BSC Sit to Stand: +2 physical assistance, Mod assist Stand pivot transfers:  +2 physical assistance, Mod assist         General transfer comment: (A) to power up and cues for hand placement    Ambulation/Gait Ambulation/Gait assistance: Mod assist, +2 safety/equipment Gait Distance (Feet): 12 Feet Assistive device: Rolling walker (2 wheels) Gait Pattern/deviations: Step-to pattern, Decreased stride length, Shuffle, Trunk flexed, Wide base of support, Decreased dorsiflexion - left, Decreased dorsiflexion - right       General Gait Details: flexed posture increasingly and increased distance to walker with cues for longer stride lengths and increasead upright posture, assist for balance, walker management and safety; +2 for chair follow.  Stairs            Wheelchair Mobility     Tilt Bed    Modified Rankin (Stroke Patients Only) Modified Rankin (Stroke Patients Only) Pre-Morbid Rankin Score: Moderate disability Modified Rankin: Moderately severe disability     Balance Overall balance assessment: Needs assistance Sitting-balance support: Bilateral upper extremity supported, Feet supported Sitting balance-Leahy Scale: Fair     Standing balance support: Bilateral upper extremity supported, During functional activity, Reliant on assistive device for balance Standing balance-Leahy Scale: Poor Standing balance comment: flexed posture, and assist for balance with walker                             Pertinent Vitals/Pain Pain Assessment Pain Assessment: No/denies pain    Home Living Family/patient expects to be discharged to:: Private residence Living Arrangements: Alone Available Help at Discharge: Available PRN/intermittently Type of Home: Independent living facility Home Access: Level entry       Home  Layout: One level   Additional Comments: no falls,    Prior Function Prior Level of Function : Independent/Modified Independent             Mobility Comments: walks rollator with foward lean and only walking ~15 feet. (  limited mobility). family scheduled to move to ALF .2/28 ADLs Comments: getting (A) for adls from hired aide (2 times per week), requires (A) for hygiene with incontinence. family reports not wearing brief due to inability to thread brief onto his body so just having accidents with family helping clean surfaces     Extremity/Trunk Assessment   Upper Extremity Assessment Upper Extremity Assessment: Defer to OT evaluation    Lower Extremity Assessment Lower Extremity Assessment: RLE deficits/detail;LLE deficits/detail;Generalized weakness RLE Deficits / Details: tight hamstrings, decreased strength but >3/5 LLE Deficits / Details: tight hamstrings, decreased strength but >3/5 LLE Coordination: decreased gross motor    Cervical / Trunk Assessment Cervical / Trunk Assessment: Kyphotic  Communication   Communication Communication: Impaired Factors Affecting Communication: Hearing impaired    Cognition Arousal: Alert Behavior During Therapy: WFL for tasks assessed/performed, Flat affect   PT - Cognitive impairments: Orientation, Attention, Initiation, Problem solving   Orientation impairments: Time, Situation                   PT - Cognition Comments: slow processing, decreased initiation, sustained attention Following commands: Intact       Cueing       General Comments General comments (skin integrity, edema, etc.): VSS, daughter, [name], in the room    Exercises     Assessment/Plan    PT Assessment Patient needs continued PT services  PT Problem List Decreased strength;Decreased mobility;Decreased safety awareness;Decreased cognition;Decreased knowledge of use of DME;Decreased balance;Decreased knowledge of precautions       PT Treatment Interventions DME instruction;Therapeutic exercise;Gait training;Balance training;Stair training;Functional mobility training;Therapeutic activities;Patient/family education    PT Goals (Current goals can be found in the  Care Plan section)  Acute Rehab PT Goals Patient Stated Goal: eventual for ALF PT Goal Formulation: With patient/family Time For Goal Achievement: 06/06/23 Potential to Achieve Goals: Good    Frequency Min 1X/week     Co-evaluation PT/OT/SLP Co-Evaluation/Treatment: Yes Reason for Co-Treatment: For patient/therapist safety;To address functional/ADL transfers PT goals addressed during session: Mobility/safety with mobility;Balance;Proper use of DME         AM-PAC PT "6 Clicks" Mobility  Outcome Measure Help needed turning from your back to your side while in a flat bed without using bedrails?: A Little Help needed moving from lying on your back to sitting on the side of a flat bed without using bedrails?: A Little Help needed moving to and from a bed to a chair (including a wheelchair)?: A Lot Help needed standing up from a chair using your arms (e.g., wheelchair or bedside chair)?: A Lot Help needed to walk in hospital room?: Total Help needed climbing 3-5 steps with a railing? : Total 6 Click Score: 12    End of Session Equipment Utilized During Treatment: Gait belt Activity Tolerance: Patient tolerated treatment well Patient left: with call bell/phone within reach;with chair alarm set;in chair   PT Visit Diagnosis: Other abnormalities of gait and mobility (R26.89);Other symptoms and signs involving the nervous system (R29.898)    Time: 7846-9629 PT Time Calculation (min) (ACUTE ONLY): 28 min   Charges:   PT Evaluation $PT Eval Moderate Complexity: 1 Mod   PT General Charges $$ ACUTE PT VISIT: 1 Visit  Sheran Lawless, PT Acute Rehabilitation Services Office:[phone] 05/23/2023   Elray Mcgregor 05/23/2023, 4:10 PM

## 2023-05-23 NOTE — TOC Initial Note (Signed)
 Transition of Care [facility]) - Initial/Assessment Note    Patient Details  Name: Alexander Price MRN: [id number] Date of Birth: 12/21/1945  Transition of [facility]) CM/SW Contact:    Mearl Latin, LCSW Phone Number: 05/23/2023, 5:37 PM  Clinical Narrative:                 CSW received consult for possible SNF placement at time of discharge. CSW spoke with patient's daughter. She reported that patient had toured [facility] and went through the assessment process and unfortunately had the stroke when he got back to his [place]. Given patient's current physical needs and fall risk, daughter is in agreement with SNF for rehab prior to proceeding with ALF. Patient reports preference for somewhere with good therapy such as [facility] or [facility] (she works at [employer] as a Therapist, music so does not really want him to be at her workplace in that capacity though she likes [place]). CSW discussed insurance authorization process and will provide Medicare SNF ratings list. CSW will send out referrals for review and provide bed offers as available.    Skilled Nursing Rehab Facilities-   [email or web address]   Ratings out of 5 stars (5 the highest)   Name Address  Phone # Quality Care Staffing Health Inspection Overall  [facility] [address] [place] [phone] 3 3 4 4   [facility] [address] [place] [phone] 5 1 4 4   [facility] [address], [facility] [phone] 2 2 2 2   [facility] [address] [place] [phone] 5 2 4 5   [facility]  [address], [place] [phone] 4 3 5 5   [facility] [address] [facility] [phone] 4 2 2 2   [facility] [address] [place] [phone] 5 1 2 2   [facility] [phone] [address] [place] [phone] 2 1 3 2   [address] ([facility]) [address] [place] [phone] 2 3 3 3   [facility] [address] [place], [place] [phone] 3 3 2 2   [facility]) [address], [place] [phone] 3 1 1 1   [facility] [address] [place] [phone] 2 3 4 4   [facility] [address], [place] [phone] 4 4 3 3   [address] ([facility]) [address], [place] [phone] 1 2 4 3           [facility] [address] [place] [phone] 2 1 4 3   [facility] [address] [place] [phone] 4 2 1 1   [facility]  [address] [place], [place] [address] [phone] 2 2 2 2   [facility] [place] [address] [phone] 3 2 4 4   [facility] [address], [place] [phone] 2 1 2 1   [facility] (No [facility]) [address], [place] [phone] 5 4 5 5   [facility] [address] [place] [phone] 3 4 2 2   [facility] [address], [place] [phone] 2 1 1 1   [facility] [address] Liliane [place] [phone] 4 2 5 5   [facility] [address] [place] [phone] 4 1 1 1   [facility] [address] [place], [place] [phone] 2 2 3 3           [facility]  [phone] 3 1 1 1   [facility] [address], [place] Cl[place]  [phone] 3 3 3 3   [facility] ([facility] [place])  [address], [place] [phone] 2 2 4 4   [facility]) [address], [place] [phone] 2 1 1 1   [facility] [address] [place] [phone] 4 3 5 5   [facility] [address], [place] [phone] 1 1 1 1           [facility] [address] [place], [place] [phone] 5 4 5 5   [facility])  [address] [place] [phone] 1 1 2 1   [facility]) [address] [place] [phone]  2 4 4   [facility] [address], [place] [phone] 3 5 5 5   [address] [place], [place] [phone] 4 2 2 2   [facility]) [address] [place] [phone] 2 1 3 2      Expected Discharge Plan: Skilled Nursing Facility Barriers to Discharge: Continued Medical Work up, English as a second language teacher, SNF Pending bed offer   Patient Goals and CMS Choice Patient states their goals for this hospitalization and ongoing recovery are:: Rehab then transition to ALF CMS Medicare.gov Compare Post Acute Care list provided to:: Patient Represenative (must comment) Choice offered to / list presented to : Adult Children [place] ownership interest in [facility].provided to:: Adult Children    Expected Discharge Plan and Services In-house Referral: Clinical Social Work   Post Acute Care Choice: Skilled Nursing Facility Living arrangements for the past 2 months: Independent Living Facility                                      Prior Living Arrangements/Services Living arrangements for the past 2 months: Independent Living Facility Lives with:: Self Patient language and need for interpreter reviewed:: Yes Do you feel safe going back to the place where you live?: Yes      Need for Family Participation in Patient Care: Yes (Comment) Care giver support system in place?: Yes (comment)   Criminal Activity/Legal Involvement Pertinent to Current Situation/Hospitalization: No - Comment as needed  Activities of Daily Living   ADL Screening (condition at time of admission) Independently performs ADLs?: No Does the patient have a NEW difficulty with bathing/dressing/toileting/self-feeding that is expected to last >3 days?: Yes (Initiates electronic notice to provider for possible OT consult) (stroke workup) Does the patient have a NEW difficulty with getting in/out of bed, walking, or climbing stairs that is expected to last >3 days?: Yes (Initiates electronic notice to provider for possible PT consult) (stroke workup) Does the patient have a NEW difficulty with communication  that is expected to last >3 days?: Yes (Initiates electronic notice to provider for possible SLP consult) (stroke workup) Is the patient deaf or have difficulty hearing?: Yes Does the patient have difficulty seeing, even when wearing glasses/contacts?: Yes Does the patient have difficulty concentrating, remembering, or making decisions?: Yes  Permission Sought/Granted Permission sought to share information with : Facility Medical sales representative, Family Supports Permission granted to share information with : Yes, Verbal Permission Granted  Share Information with NAME: [name]  Permission granted to share info w AGENCY: SNFs  Permission granted to share info w Relationship: Daughter  Permission granted to share info w Contact Information: [phone]  Emotional Assessment Appearance:: Appears stated age Attitude/Demeanor/Rapport: Unable to Assess Affect (typically observed): Unable to Assess Orientation: : Oriented to Self, Oriented to Place, Oriented to Situation Alcohol / Substance  Use: Not Applicable Psych Involvement: No (comment)  Admission diagnosis:  Stroke determined by clinical assessment [facility]) [I63.9] Patient Active Problem List   Diagnosis Date Noted   Stroke determined by clinical assessment (HCC) 05/22/2023   Hypothyroidism (acquired) 12/06/2021   Non-traumatic rhabdomyolysis    COVID-19 virus infection 08/04/2021   Hyperlipidemia    Depression    PCP:  Philip Aspen, Limmie Patricia, MD Pharmacy:   [facility] [phone] [place], [place] - [address] [place] [address] Phone: [phone] Fax: [phone]     Social Drivers of Health (SDOH) Social History: SDOH Screenings   Food Insecurity: No Food Insecurity (05/22/2023)  Housing: Low Risk  (05/22/2023)  Transportation Needs: No Transportation Needs (05/22/2023)  Utilities: Not At Risk (05/22/2023)  Depression (PHQ2-9): High Risk (03/10/2023)  Financial Resource Strain: Low Risk   (05/05/2018)   [facility] from [facility], [facility]  Physical Activity: Insufficiently Active (05/05/2018)   [facility] from [facility], [facility]  Social Connections: Socially Isolated (05/22/2023)  Stress: No Stress Concern Present (05/05/2018)   [facility], [facility]  Tobacco Use: Low Risk  (05/22/2023)  Health Literacy: Medium Risk (07/02/2020)   [facility], [facility]   SDOH Interventions:     Readmission Risk Interventions     No data to display

## 2023-05-23 NOTE — Evaluation (Signed)
 Clinical/Bedside Swallow Evaluation Patient Details  Name: Alexander Price MRN: [id number] Date of Birth: 10-07-1945  Today's Date: 05/23/2023 Time: SLP Start Time (ACUTE ONLY): 0914 SLP Stop Time (ACUTE ONLY): 0932 SLP Time Calculation (min) (ACUTE ONLY): 18 min  Past Medical History:  Past Medical History:  Diagnosis Date   Depression    sees Alexander Savage NP at [facility]    Hyperlipidemia    Past Surgical History:  Past Surgical History:  Procedure Laterality Date   TONSILLECTOMY     HPI:  78 yo male lives ALF ([place]) presented with left sided weakness and slurred speech. Initial imaging with no evidence of stroke. Underwent TNK. 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    Assessment / Plan / Recommendation  Clinical Impression  Alexander Price presents with functional swallowing. Oral mechanism exam was normal; dentition present. Mild left facial asymmetry noted.  No other focal deficits. He completed his own oral care.  Demonstrated some impulsivity when eating crackers and drinking water, leading to an episode of coughing.  No other coughing or signs of airway intrusion noted.  There was thorough mastication with no residue post-swallow; the appearance of a brisk swallow response; no s/s of dysphagia. Recommend that he continue a regular diet with thin liquids; meds whole with pills. No f/u for swallowing is needed. SLP Visit Diagnosis: Dysphagia, unspecified (R13.10)    Aspiration Risk  No limitations    Diet Recommendation    Regular solids, thin liquids  Medication Administration: Whole meds with liquid    Other  Recommendations Oral Care Recommendations: Oral care BID    Recommendations for follow up therapy are one component of a multi-disciplinary discharge planning process, led by the attending physician.  Recommendations may be updated based on patient status,  additional functional criteria and insurance authorization.  Follow up Recommendations No SLP follow up        Swallow Study   General Date of Onset: 05/22/23 HPI: 78 yo male lives ALF [facility]) presented with left sided weakness and slurred speech. Initial imaging with no evidence of stroke. Underwent TNK. 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 Type of Study: Bedside Swallow Evaluation Previous Swallow Assessment: no Diet Prior to this Study: Regular;Thin liquids (Level 0) Temperature Spikes Noted: No Respiratory Status: Room air History of Recent Intubation: No Behavior/Cognition: Alert;Cooperative Oral Cavity Assessment: Within Functional Limits Oral Care Completed by SLP: Yes (Pt brushed his teeth independently) Oral Cavity - Dentition: Adequate natural dentition Self-Feeding Abilities: Able to feed self Patient Positioning: Upright in bed Baseline Vocal Quality: Normal Volitional Cough: Strong Volitional Swallow: Able to elicit    Oral/Motor/Sensory Function Overall Oral Motor/Sensory Function: Mild impairment Facial Symmetry: Abnormal symmetry left;Suspected CN VII (facial) dysfunction (mild)   Ice Chips Ice chips: Within functional limits   Thin Liquid Thin Liquid: Within functional limits    Nectar Thick Nectar Thick Liquid: Not tested   Honey Thick Honey Thick Liquid: Not tested   Puree Puree: Within functional limits   Solid     Solid:  (coughed after putting entire cracker in his mouth and following with water)      Alexander Price 05/23/2023,11:26 AM  Alexander Folks L. Samson Frederic, MA CCC/SLP Clinical Specialist - Acute Care SLP Acute Rehabilitation Services Office number [phone]

## 2023-05-23 NOTE — Progress Notes (Signed)
 OT EVALUATION  PT admitted with L side weakness and aphasia. Pt currently with functional limitiations due to the deficits listed below (see OT problem list). Pt required (A) for bathing, dressing, incontinence and progressive decline in mobility. Pt at this time could benefit from use of stedy for transfer with RN staff oob to chair for meal  Pt will benefit from skilled OT to increase their independence and safety with adls and balance to allow discharge skilled inpatient follow up therapy, <3 hours/day. Marland Kitchen   05/23/23 1000  OT Visit Information  Last OT Received On 05/23/23  Assistance Needed +2  PT/OT/SLP Co-Evaluation/Treatment Yes  Reason for Co-Treatment For patient/therapist safety;To address functional/ADL transfers  OT goals addressed during session ADL's and self-care;Proper use of Adaptive equipment and DME;Strengthening/ROM  History of Present Illness 78 yo male lives ALF ([facility]) L side weakness and slurred speech. NIHSS 7 PMH HLD major depression action tremors  Precautions  Precautions Fall  Restrictions  Weight Bearing Restrictions Per Provider Order No  Home Living  Family/patient expects to be discharged to: Private residence  Type of Home Independent living facility  Home Access Level entry  Home Layout One level  Bathroom Shower/Tub Walk-in shower  Bathroom Toilet Handicapped height  Additional Comments no falls,  Prior Function  Prior Level of Function  Independent/Modified Independent  Mobility Comments walks rollator with foward lean and only walking ~15 feet. ( limited mobility). family scheduled to move to ALF .2/28  ADLs Comments getting (A) for adls from hired aide (2 times per week), requires (A) for hygiene with incontinence. family reports not wearing brief due to inability to thread brief onto his body so just having accidents with family helping clean surfaces  Pain Assessment  Pain Assessment No/denies pain  Cognition  Arousal Alert  Behavior  During Therapy [facility] for tasks assessed/performed  Cognition Cognition impaired  Orientation impairments Person (reports DOB [birth year] instead of [birth year]. unable to give year or month)  Awareness Online awareness impaired;Intellectual awareness impaired  Memory impairment (select all impairments) Working memory  OT - Cognition Comments daughter reports that flat appearance is not too far off baseline but his cognition has varied throughout the day.  Following Commands  Following commands Intact  Communication  Communication Impaired  Factors Affecting Communication Hearing impaired  Upper Extremity Assessment  Upper Extremity Assessment Generalized weakness  Lower Extremity Assessment  Lower Extremity Assessment Defer to PT evaluation  Cervical / Trunk Assessment  Cervical / Trunk Assessment Kyphotic  Vision- History  Baseline Vision/History 1 Wears glasses  Ability to See in Adequate Light 1 Impaired  Patient Visual Report Blurring of vision  Vision- Assessment  Additional Comments no glasses present at this time  ADL  Overall ADL's  Needs assistance/impaired  Eating/Feeding Minimal assistance  Grooming Minimal assistance  Upper Body Bathing Moderate assistance  Lower Body Bathing Maximal assistance  Lower Body Dressing Maximal assistance  Toilet Transfer +2 for physical assistance;Moderate assistance;Ambulation;Rolling walker (2 wheels)  Functional mobility during ADLs +2 for physical assistance;Moderate assistance;Rolling walker (2 wheels)  General ADL Comments pt requires cues for position in RW  Bed Mobility  Overal bed mobility Needs Assistance  Bed Mobility Rolling;Supine to Sit  Rolling Contact guard assist  Supine to sit Min assist  General bed mobility comments HOB 45 and min (A) to elevate trunk from surface  Transfers  Overall transfer level Needs assistance  Equipment used Rolling walker (2 wheels)  Transfers Sit to/from Stand;Bed to chair/wheelchair/BSC  Sit to Stand  +  2 physical assistance;Mod assist  Bed to/from chair/wheelchair/BSC transfer type: Stand pivot  Stand pivot transfers +2 physical assistance;Mod assist  General transfer comment (A) to power up adn cues for hand placement  Balance  Overall balance assessment Needs assistance  Sitting-balance support Bilateral upper extremity supported;Feet supported  Sitting balance-Leahy Scale Fair  Standing balance support Bilateral upper extremity supported;During functional activity;Reliant on assistive device for balance  Standing balance-Leahy Scale Poor  General Comments  General comments (skin integrity, edema, etc.) VSS  OT - End of Session  Equipment Utilized During Treatment Gait belt;Rolling walker (2 wheels)  Activity Tolerance Patient tolerated treatment well  Patient left in chair;with call bell/phone within reach;with chair alarm set;with family/visitor present  Nurse Communication Mobility status;Precautions  OT Assessment  OT Recommendation/Assessment Patient needs continued OT Services  OT Visit Diagnosis Unsteadiness on feet (R26.81);Muscle weakness (generalized) (M62.81)  OT Problem List Impaired balance (sitting and/or standing);Decreased activity tolerance;Decreased safety awareness;Decreased knowledge of use of DME or AE;Decreased knowledge of precautions;Decreased cognition;Decreased coordination;Decreased strength;Obesity  OT Plan  OT Frequency (ACUTE ONLY) Min 1X/week  OT Treatment/Interventions (ACUTE ONLY) Self-care/ADL training;DME and/or AE instruction;Energy conservation;Therapeutic activities;Cognitive remediation/compensation;Patient/family education;Balance training;Neuromuscular education;Therapeutic exercise  AM-PAC OT "6 Clicks" Daily Activity Outcome Measure (Version 2)  Help from another person eating meals? 3  Help from another person taking care of personal grooming? 2  Help from another person toileting, which includes using toliet, bedpan, or urinal? 2  Help  from another person bathing (including washing, rinsing, drying)? 2  Help from another person to put on and taking off regular upper body clothing? 2  Help from another person to put on and taking off regular lower body clothing? 2  6 Click Score 13  Progressive Mobility  What is the highest level of mobility based on the progressive mobility assessment? Level 4 (Walks with assist in room) - Balance while marching in place and cannot step forward and back - Complete  Mobility Referral No  Activity Turned to right side  OT Recommendation  Follow Up Recommendations Skilled nursing-short term rehab (<3 hours/day)  Patient can return home with the following Two people to help with walking and/or transfers;Two people to help with bathing/dressing/bathroom  Functional Status Assessent Patient has had a recent decline in their functional status and demonstrates the ability to make significant improvements in function in a reasonable and predictable amount of time.  OT Equipment BSC/3in1;Wheelchair (measurements OT);Wheelchair cushion (measurements OT);Hospital bed  Individuals Consulted  Consulted and Agree with Results and Recommendations Family member/caregiver;Patient  Family Member Consulted daughter  Acute Rehab OT Goals  Patient Stated Goal to return to ALF  OT Goal Formulation With patient/family  Time For Goal Achievement 06/06/23  Potential to Achieve Goals Good  OT Time Calculation  OT Start Time (ACUTE ONLY) 1045  OT Stop Time (ACUTE ONLY) 1113  OT Time Calculation (min) 28 min  OT General Charges  $OT Visit 1 Visit  OT Evaluation  $OT Eval Moderate Complexity 1 Mod   Brynn, OTR/L  Acute Rehabilitation Services Office: [phone] .

## 2023-05-24 DIAGNOSIS — G2111 Neuroleptic induced parkinsonism: Secondary | ICD-10-CM | POA: Diagnosis not present

## 2023-05-24 DIAGNOSIS — R29704 NIHSS score 4: Secondary | ICD-10-CM | POA: Diagnosis not present

## 2023-05-24 DIAGNOSIS — F332 Major depressive disorder, recurrent severe without psychotic features: Secondary | ICD-10-CM | POA: Diagnosis not present

## 2023-05-24 DIAGNOSIS — T43505A Adverse effect of unspecified antipsychotics and neuroleptics, initial encounter: Secondary | ICD-10-CM | POA: Diagnosis not present

## 2023-05-24 DIAGNOSIS — I639 Cerebral infarction, unspecified: Secondary | ICD-10-CM | POA: Diagnosis not present

## 2023-05-24 LAB — BASIC METABOLIC PANEL
Anion gap: 12 (ref 5–15)
BUN: 14 mg/dL (ref 8–23)
CO2: 20 mmol/L — ABNORMAL LOW (ref 22–32)
Calcium: 8.9 mg/dL (ref 8.9–10.3)
Chloride: 108 mmol/L (ref 98–111)
Creatinine, Ser: 1.23 mg/dL (ref 0.61–1.24)
GFR, Estimated: >60 mL/min (ref >60)
Glucose, Bld: 99 mg/dL (ref 70–99)
Potassium: 3.6 mmol/L (ref 3.5–5.1)
Sodium: 140 mmol/L (ref 135–145)

## 2023-05-24 LAB — CBC
HCT: 40.3 % (ref 39.0–52.0)
Hemoglobin: 13.3 g/dL (ref 13.0–17.0)
MCH: 32 pg (ref 26.0–34.0)
MCHC: 33 g/dL (ref 30.0–36.0)
MCV: 97.1 fL (ref 80.0–100.0)
Platelets: 223 10*3/uL (ref 150–400)
RBC: 4.15 MIL/uL — ABNORMAL LOW (ref 4.22–5.81)
RDW: 12.4 % (ref 11.5–15.5)
WBC: 6.5 10*3/uL (ref 4.0–10.5)
nRBC: 0 % (ref 0.0–0.2)

## 2023-05-24 LAB — LITHIUM LEVEL: Lithium Lvl: 0.19 mmol/L — ABNORMAL LOW (ref 0.60–1.20)

## 2023-05-24 MED ORDER — ASPIRIN 81 MG PO TBEC
81.0000 mg | DELAYED_RELEASE_TABLET | Freq: Every day | ORAL | Status: DC
Start: 2023-05-24 — End: 2023-05-29
  Administered 2023-05-24 – 2023-05-28 (×5): 81 mg via ORAL
  Filled 2023-05-24 (×5): qty 1

## 2023-05-24 MED ORDER — MIRTAZAPINE 15 MG PO TABS
7.5000 mg | ORAL_TABLET | Freq: Every day | ORAL | Status: DC
  Administered 2023-05-24 – 2023-05-27 (×4): 7.5 mg via ORAL
  Filled 2023-05-24 (×4): qty 1

## 2023-05-24 NOTE — Progress Notes (Signed)
 Patient had no complaints of pain. Voiding spontaneously. No neuro changes noted. Patient transferred to [address]. Patient and family updated in plan of care.

## 2023-05-24 NOTE — Progress Notes (Addendum)
 STROKE TEAM PROGRESS NOTE    SIGNIFICANT HOSPITAL EVENTS  TNK given 2/27 @ 1931  INTERIM HISTORY/SUBJECTIVE  MRI negative for stroke.   Daughter at bedside. Patient oriented to place, slow responses with repetition of questions, poor attention. Resting tremor continued with some cogwheeling present.   Will transfer out of ICU today.    OBJECTIVE  CBC    Component Value Date/Time   WBC 6.5 05/24/2023 0623   RBC 4.15 (L) 05/24/2023 0623   HGB 13.3 05/24/2023 0623   HCT 40.3 05/24/2023 0623   PLT 223 05/24/2023 0623   MCV 97.1 05/24/2023 0623   MCH 32.0 05/24/2023 0623   MCHC 33.0 05/24/2023 0623   RDW 12.4 05/24/2023 0623   LYMPHSABS 1.5 05/22/2023 1909   MONOABS 0.7 05/22/2023 1909   EOSABS 0.2 05/22/2023 1909   BASOSABS 0.1 05/22/2023 1909    BMET    Component Value Date/Time   NA 140 05/24/2023 0623   NA 140 11/30/2020 0000   K 3.6 05/24/2023 0623   CL 108 05/24/2023 0623   CO2 20 (L) 05/24/2023 0623   GLUCOSE 99 05/24/2023 0623   BUN 14 05/24/2023 0623   BUN 15 11/30/2020 0000   CREATININE 1.23 05/24/2023 0623   CALCIUM 8.9 05/24/2023 0623   EGFR 60.0 12/13/2022 0822   GFRNONAA >60 05/24/2023 0623    IMAGING past 24 hours MR BRAIN WO CONTRAST Result Date: 05/23/2023 CLINICAL DATA:  Initial evaluation for acute neuro deficit, stroke suspected EXAM: MRI HEAD WITHOUT CONTRAST TECHNIQUE: Multiplanar, multiecho pulse sequences of the brain and surrounding structures were obtained without intravenous contrast. COMPARISON:  Comparison made with prior CTs from 05/22/2023 and MRI from 08/05/2021. FINDINGS: Brain: Examination moderately degraded by motion artifact. Generalized age-related cerebral atrophy. Few scattered foci of patchy T2/FLAIR hyperintensity involving the supratentorial cerebral white matter, most likely related to chronic microvascular ischemic disease, minor for age. No evidence for acute or subacute ischemia. Gray-white matter differentiation  maintained. No areas of chronic cortical infarction. No visible acute or chronic intracranial blood products. No mass lesion, midline shift or mass effect. No hydrocephalus or extra-axial fluid collection. Pituitary gland within normal limits. Vascular: Major intracranial vascular flow voids are maintained. Skull and upper cervical spine: Craniocervical junction within normal limits. Bone marrow signal intensity normal. No scalp soft tissue abnormality. Sinuses/Orbits: Globes and orbital soft tissues within normal limits. Paranasal sinuses are largely clear. Moderate to large bilateral mastoid effusions. Image nasopharynx unremarkable. Other: None. IMPRESSION: 1. No acute intracranial abnormality. 2. Generalized age-related cerebral atrophy with minor chronic small vessel ischemic disease. 3. Moderate to large bilateral mastoid effusions, of uncertain significance. Correlation with physical exam recommended. Imaged nasopharynx unremarkable. Electronically Signed   By: Rise Mu M.D.   On: 05/23/2023 22:29   ECHOCARDIOGRAM COMPLETE Result Date: 05/23/2023    ECHOCARDIOGRAM REPORT   Patient Name:   Alexander Price Andal Date of Exam: 05/23/2023 Medical Rec #:  [id number]       Height:       72.0 in Accession #:    [id number]      Weight:       253.5 lb Date of Birth:  1945/08/15      BSA:          2.357 m Patient Age:    78 years        BP:           140/68 mmHg Patient Gender: M  HR:           75 bpm. Exam Location:  Inpatient Procedure: 2D Echo, Cardiac Doppler, Color Doppler and Intracardiac            Opacification Agent (Both Spectral and Color Flow Doppler were            utilized during procedure). Indications:    Stroke  History:        Patient has no prior history of Echocardiogram examinations.                 Risk Factors:HLD.  Sonographer:    Webb Laws Referring Phys: [id number] SALMAN KHALIQDINA IMPRESSIONS  1. Left ventricular ejection fraction, by estimation, is 60 to 65%. The  left ventricle has normal function. The left ventricle has no regional wall motion abnormalities. Left ventricular diastolic parameters are consistent with Grade I diastolic dysfunction (impaired relaxation).  2. Right ventricular systolic function is normal. The right ventricular size is normal. Tricuspid regurgitation signal is inadequate for assessing PA pressure.  3. The mitral valve is normal in structure. No evidence of mitral valve regurgitation. No evidence of mitral stenosis.  4. The aortic valve is tricuspid. There is mild calcification of the aortic valve. Aortic valve regurgitation is not visualized. No aortic stenosis is present.  5. IVC not visualized. FINDINGS  Left Ventricle: Left ventricular ejection fraction, by estimation, is 60 to 65%. The left ventricle has normal function. The left ventricle has no regional wall motion abnormalities. The left ventricular internal cavity size was normal in size. There is  no left ventricular hypertrophy. Left ventricular diastolic parameters are consistent with Grade I diastolic dysfunction (impaired relaxation). Right Ventricle: The right ventricular size is normal. No increase in right ventricular wall thickness. Right ventricular systolic function is normal. Tricuspid regurgitation signal is inadequate for assessing PA pressure. Left Atrium: Left atrial size was normal in size. Right Atrium: Right atrial size was normal in size. Pericardium: There is no evidence of pericardial effusion. Mitral Valve: The mitral valve is normal in structure. No evidence of mitral valve regurgitation. No evidence of mitral valve stenosis. Tricuspid Valve: The tricuspid valve is normal in structure. Tricuspid valve regurgitation is trivial. Aortic Valve: The aortic valve is tricuspid. There is mild calcification of the aortic valve. Aortic valve regurgitation is not visualized. No aortic stenosis is present. Pulmonic Valve: The pulmonic valve was not well visualized. Pulmonic  valve regurgitation is not visualized. Aorta: The aortic root is normal in size and structure. Venous: IVC not visualized. The inferior vena cava was not well visualized. IAS/Shunts: No atrial level shunt detected by color flow Doppler.  LEFT VENTRICLE PLAX 2D LVIDd:         4.50 cm   Diastology LVIDs:         2.50 cm   LV e' medial:    6.84 cm/s LV PW:         0.90 cm   LV E/e' medial:  9.6 LV IVS:        1.00 cm   LV e' lateral:   11.50 cm/s LVOT diam:     2.10 cm   LV E/e' lateral: 5.7 LV SV:         58 LV SV Index:   25 LVOT Area:     3.46 cm  RIGHT VENTRICLE RV Basal diam:  4.10 cm RV S prime:     19.70 cm/s TAPSE (M-mode): 3.0 cm LEFT ATRIUM  Index       RIGHT ATRIUM           Index LA diam:      2.50 cm 1.06 cm/m  RA Area:     14.50 cm LA Vol (A4C): 19.6 ml 8.32 ml/m  RA Volume:   30.20 ml  12.82 ml/m  AORTIC VALVE LVOT Vmax:   107.00 cm/s LVOT Vmean:  70.300 cm/s LVOT VTI:    0.168 m  AORTA Ao Root diam: 3.10 cm MITRAL VALVE MV Area (PHT): 3.60 cm    SHUNTS MV Decel Time: 211 msec    Systemic VTI:  0.17 m MV E velocity: 65.50 cm/s  Systemic Diam: 2.10 cm MV A velocity: 80.00 cm/s MV E/A ratio:  0.82 Dalton McleanMD Electronically signed by Wilfred Lacy Signature Date/Time: 05/23/2023/12:45:34 PM    Final     Vitals:   05/24/23 0500 05/24/23 0600 05/24/23 0730 05/24/23 0800  BP: 116/71 91/65 126/66 (!) 155/75  Pulse:   75 70  Resp: (!) 21 16 16 17   Temp:    98.4 F (36.9 C)  TempSrc:    Oral  SpO2:   92% 92%  Weight:      Height:         PHYSICAL EXAM General:  Alert, well-nourished, well-developed patient in no acute distress Psych:  Mood and affect appropriate for situation, calm and cooperative CV: Regular rate and rhythm on monitor Respiratory:  Regular, unlabored respirations on room air GI: Abdomen soft and nontender  NEURO:  Mental Status/Speech: Patient is oriented to self, age, place.  He is not oriented to year or month.  Continued slowed responses with some  repetition/perseveration.   Cranial Nerves:  II: PERRL. Visual fields full.  III, IV, VI: Decreased EOM to left, but consistent blink to threat. Slowed EOM bilaterally.  V: Sensation is intact to light touch and symmetrical to face.  VII: Face is symmetrical resting and smiling VIII: hearing intact to voice. IX, X: Palate elevates symmetrically. Phonation is normal.  HK:[id number] shrug 5/5. XII: tongue is midline without fasciculations. Motor: 5/5 strength to all muscle groups tested.  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 But assessment looks more like resting tremors, R>L. Possibility for secondary parkinson's. Tone: is normal and bulk is normal Sensation-variable responses to sensation exam.   Coordination: FTN intact bilaterally, HKS: no ataxia in BLE.No drift.  Gait- deferred  Most Recent NIH: 4    ASSESSMENT/PLAN  Osmel Price is a 78 y.o. 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 Intermittent b/l resting tremor R>L Significant bradykinesia with mild cog wheeling Mild rigidity and masked face Per daughter, pt walk with walker  but shuffling gait and  stooped posturing Cognitive impairment on Aricept PTA Pt has been on multiple psych meds including zyprexa and lithium Was told to have "tardive dyskinesia" in the past Psychiatry on board, appreciate assistance D/c olanzapine  Continue aricept   Hx of Depression Extensive Psych History with suicidal ideation Home meds: Ritalin, Remeron, Lithium, Zyprexa, prazosin Psych consulted and appreciate assistance Will d/c Zyprexa, lower mirtazapine, continue cymbalta and prazosin Check lithium level, if normal, resume 450mg  Qhs  Hypertension Home meds:  prasozin 5mg  at bedtime Stable BP goal < 180/105  [place] term BP goal normotensive  Hyperlipidemia Home meds:  none LDL 136, goal < 70 Add Lipitor  40mg  Continue statin at discharge  Other Stroke Risk Factors Obesity, Body mass index is 34.38 kg/m., BMI >/= 30 associated with increased stroke risk, recommend weight loss, diet and exercise as appropriate   Other Active Problems Hypokalemia, supplemented and will continue to monitor Cognitive impairment, decline for the last 4 months, especially last 2 months.  On Aricept at home, continue  Hospital day # 2   Pt seen by Neuro NP/APP and later by MD. Note/plan to be edited by MD as needed.    Alexander January, DNP, AGACNP-BC [facility] Please use AMION for contact information & EPIC for messaging.  ATTENDING NOTE: I reviewed above note and agree with the assessment and plan. Pt was seen and examined.   Daughter at bedside.  Patient lying bed, awake, still has psychomotor slowing, not orientated to time or age but orientated to place.  Neuro stable, still has parkinsonism.  Appreciate psychiatry input, will DC Zyprexa, lower Remeron, continue Cymbalta, prazosin and Aricept.  Check lithium level, if normal, resume lithium 450 mg nightly.  MRI no stroke.  Patient presenting system likely stroke mimic due to parkinsonism.  Will continue aspirin  monotherapy and put on statin.  PT and OT recommend SNF.  For detailed assessment and plan, please refer to above/below as I have made changes wherever appropriate.   Marvel Plan, MD PhD Stroke Neurology 05/24/2023 12:55 PM   This patient is critically ill due to stroke like symptoms post TNK, parkinsonism and at significant risk of neurological worsening, death form stroke, hemorrhagic conversion, bleeding from TNK, falls. This patient's care requires constant monitoring of vital signs, hemodynamics, respiratory and cardiac monitoring, review of multiple databases, neurological assessment, discussion with family, other specialists and medical decision making of high complexity. I spent 40 minutes of neurocritical care time in the care of this patient. I had [place]ising[place] discussion with daughter at bedside, updated pt current condition, treatment plan and potential prognosis, and answered all the questions. She expressed understanding and appreciation.     To contact Stroke Continuity provider, please refer to [email or web address]. After hours, contact General Neurology

## 2023-05-24 NOTE — Consult Note (Signed)
 [facility] Psychiatric Consult Initial  Patient Name: .Alexander Price  MRN: [id number]  DOB: 1945/07/18  Consult Order details:  Orders (From admission, onward)     Start     Ordered   05/23/23 1221  IP CONSULT TO PSYCHIATRY       Ordering Provider: Lynnae January, NP  Provider:  (Not yet assigned)  Question Answer Comment  Location [facility]   Reason for Consult? multiple psych comorbidities, extensive psych history, psych medication management      05/23/23 1223             Mode of Visit: In person    Psychiatry Consult Evaluation  Service Date: May 24, 2023 LOS:  LOS: 2 days  Chief Complaint "I feel depressed"  Primary Psychiatric Diagnoses  Major Depressive disorder, recurrent, severe without psychotic features  Assessment  Alexander Price is a 78 y.o. male admitted: Medicallyfor 05/22/2023  7:08 PM for possible stroke. He carries the psychiatric diagnoses of Major Depression and has a past medical history of hypertension, hyperlipidemia, hypothyroidism, Parkinsonism, and ongoing stroke work-up.  His current presentation of depressed mood, lack of motivation, low energy level, poor concentration which has been ongoing for years and history of ECT is most consistent with severe, recurrent, major depressive disorder, vs treatment resistance depression. He does not meet criteria for inpatient admission based on absent suicidal or homicidal ideation, intent or plan and good support from family. Current outpatient psychotropic medications include Cymbalta 90 mg daily, Lithium Carbonate 450 mg at bedtime, Mirtazapine 15 mg at bedtime, Olanzapine 2.5 mg and Prazosin 5 mg at bedtime and historically he has had a moderate response to these medications. He was compliant with medications prior to admission as evidenced by reports from the daughter. On initial examination, patient is awake and alert, with psychomotor retardation and poor eye contact. There is  intermittent resting tremors, bradykinesia, and limb rigidity. His speech is low volume, mood is "depressed" with constricted affect. No SI/HI and no perceptual abnormalities.    Please see plan below for detailed recommendations.   Diagnoses:  Active Hospital problems: Principal Problem:   Stroke determined by clinical assessment (HCC)    Plan   ## Psychiatric Medication Recommendations:  --Continue Cymbalta 90 daily for depression --Continue Prazosin 5 mg at bedtime for nightmares --Mirtazapine 7.5 mg at bedtime for sleep. --Please obtain Lithium level before starting Lithium as it may worsen Tremors if level is too high. Please note that Lithium is being used as an augmentation therapy for severe depression in this patient.  -Start Lithium 450 mg at bedtime once the levels comes back normal.  --Discontinue Olanzapine due to risk of EPS.   ## Medical Decision Making Capacity: Not specifically addressed in this encounter  ## Further Work-up:  -- Lithium level, TSH, B12 and Folate level.  -- most recent EKG on 05/22/23 had QtC of 448 -- Pertinent labwork reviewed earlier this admission includes:HDL-35, LDL-136.    ## Disposition:-- There are no psychiatric contraindications to discharge at this time  ## Behavioral / Environmental: -Delirium Precautions: Delirium Interventions for Nursing and Staff: - RN to open blinds every AM. - To Bedside: Glasses, hearing aide, and pt's own shoes. Make available to patients. when possible and encourage use. - Encourage po fluids when appropriate, keep fluids within reach. - OOB to chair with meals. - Passive ROM exercises to all extremities with AM & PM care. - RN to assess orientation to person, time and place  QAM and PRN. - Recommend extended visitation hours with familiar family/friends as feasible. - Staff to minimize disturbances at night. Turn off television when pt asleep or when not in use. or Patient would benefit from more frequent contact  with medical team to delineate plan of care and allow for clarification questions, which will help alleviate anxiety regarding treatment. If possible, try to check back in with the pt in the afternoon.    ## Safety and Observation Level:  - Based on my clinical evaluation, I estimate the patient to be at low risk of self harm in the current setting. - At this time, we recommend  routine. This decision is based on my review of the chart including patient's history and current presentation, interview of the patient, mental status examination, and consideration of suicide risk including evaluating suicidal ideation, plan, intent, suicidal or self-harm behaviors, risk factors, and protective factors. This judgment is based on our ability to directly address suicide risk, implement suicide prevention strategies, and develop a safety plan while the patient is in the clinical setting. Please contact our team if there is a concern that risk level has changed.  CSSR Risk Category:C-SSRS RISK CATEGORY: No Risk  Suicide Risk Assessment: Patient has following modifiable risk factors for suicide: under treated depression , which we are addressing by prescribing medications. Patient has following non-modifiable or demographic risk factors for suicide: male gender, history of suicide attempt, and psychiatric hospitalization Patient has the following protective factors against suicide: Supportive family  Thank you for this consult request. Recommendations have been communicated to the primary team.  We will follow up at this time.   [name], MD       History of Present Illness  Relevant Aspects of [facility] Course:  Admitted on 05/22/2023 for stroke-like symptoms. Daughter reports patient has had trouble with words and some neuro decline for around 4 months, says he's had possible old mini strokes. Has been walking with walker with shuffling gait. Family was preparing to move him into ALF.    Patient Report:  Patient seen in his hospital room. He is awake, alert and oriented to place. However, patient is weak with slow response to questions. He is a poor historian, hence, most of the information were obtained from the patient's daughter at the bedside. Daughter reports patient has been suffering from severe and recurrent major depression for years and has had multiple psychiatric hospitalizations due to suicide attempts until 2019. However, daughter denies any recent hospitalization after he received electroconvulsive therapy treatment at [facility] in 2020. Daughter states patient currently follows up with a nurse practitioner at [facility] who prescribes his current medications including Cymbalta 90 md daily, Lithium Carbonate 450 mg at bedtime, Mirtazapine 15 mg at bedtime, Olanzapine 2.5 mg and Prazosin 5 mg at bedtime for nightmares.   Currently, daughter reports patient remains depressed and sleeping excessively, with lack of motivation, low energy level and poor concentration. However, daughter denies delusions, hallucinations, suicidal or homicidal ideation, intent or plan. She reports patient has underlying Parkinson-like symptoms with masked facie, intermittent resting tremors, bradykinesia, shuffling gate, limb rigidity and mild neuro cognitive deficit. He currently takes Aricept but has not seen his  Neurologist in 3 years.    Collateral information:  Most of the information obtained from the patient's daughter at bedside.   Review of Systems  Psychiatric/Behavioral:  Positive for depression. Negative for hallucinations, substance abuse and suicidal ideas. The patient is not nervous/anxious.  Psychiatric and Social History  Psychiatric History:  Information collected from daughter.   Prev Dx/Sx: Recurrent treatment resistance depression Current Psych Provider: Misty Stanley NP at [facility].  Home Meds (current): Cymbalta 90 md daily, Lithium Carbonate 450 mg  at bedtime, Mirtazapine 15 mg at bedtime, Olanzapine 2.5 mg and Prazosin 5 mg. Previous Med Trials:Unsure Therapy: denies   Prior Psych Hospitalization: Patient has had multiple hospitalizations in the past, but was last admitted in 2020. He has not been admitted since he had ECT treatment.   Prior Self Harm: Daughter reports 5 suicide attempts in the past before ECT ytreatment. She denies SI/SA in the last 3 years.  Prior Violence: denies   Family Psych History: Patient brother and sister have history of depression Family Hx suicide: denies   Social History:  Educational Hx: high school graduate Occupational Hx: retired.  Legal Hx: denies  Living Situation: Lives in an assited living home  Spiritual Hx: unknown  Access to weapons/lethal means: denies    Substance History Alcohol: denies   Type of alcohol denies  Last Drink denies  Number of drinks per day denies  History of alcohol withdrawal seizures N/A History of DT's N/A Tobacco: N/A Illicit drugs: Denies  Prescription drug abuse: denies  Rehab hx: denies   Exam Findings  Physical Exam:  Vital Signs:  Temp:  [98 F (36.7 C)-98.5 F (36.9 C)] 98.4 F (36.9 C) (03/01 0800) Pulse Rate:  [60-99] 81 (03/01 1000) Resp:  [9-27] 14 (03/01 1000) BP: (91-162)/(63-108) 124/93 (03/01 1000) SpO2:  [91 %-99 %] 96 % (03/01 1000) Blood pressure (!) 124/93, pulse 81, temperature 98.4 F (36.9 C), temperature source Oral, resp. rate 14, height 6' (1.829 m), weight 115 kg, SpO2 96%. Body mass index is 34.38 kg/m.  Physical Exam  Mental Status Exam: General Appearance: Casual  Orientation:  Other:  only to place and name  Memory:   poor  Concentration:  Concentration: Poor  Recall:  Poor  Attention  Fair  Eye Contact:  Poor  Speech:  Slow  Language:  Fair  Volume:  Decreased  Mood: "Depressed"  Affect:  Constricted  Thought Process:  NA  Thought Content:  NA  Suicidal Thoughts:  No  Homicidal Thoughts:  No   Judgement:  Fair  Insight:  Fair  Psychomotor Activity:  Psychomotor Retardation  Akathisia:  No  Fund of Knowledge:  Fair      Assets:  Communication Skills  Cognition:  Impaired,  Moderate  ADL's:  Impaired  AIMS (if indicated):        Other History   These have been pulled in through the EMR, reviewed, and updated if appropriate.  Family History:  The patient's Family history is unknown by patient.  Medical History: Past Medical History:  Diagnosis Date   Depression    sees Ellis Savage NP at [facility]    Hyperlipidemia     Surgical History: Past Surgical History:  Procedure Laterality Date   TONSILLECTOMY       Medications:   Current Facility-Administered Medications:    acetaminophen (TYLENOL) tablet 650 mg, 650 mg, Oral, Q4H PRN **OR** acetaminophen (TYLENOL) 160 MG/5ML solution 650 mg, 650 mg, Per Tube, Q4H PRN **OR** acetaminophen (TYLENOL) suppository 650 mg, 650 mg, Rectal, Q4H PRN, Erick Blinks, MD   aspirin EC tablet 81 mg, 81 mg, Oral, Daily, Richardo Priest, Erin C, NP, 81 mg at 05/24/23 1003   atorvastatin (LIPITOR) tablet 40 mg, 40 mg, Oral, Daily, Lynnae January, NP,  40 mg at 05/24/23 0959   Chlorhexidine Gluconate Cloth 2 % PADS 6 each, 6 each, Topical, Q0600, Erick Blinks, MD, 6 each at 05/24/23 0600   cholecalciferol (VITAMIN D3) 25 MCG (1000 UNIT) tablet 2,000 Units, 2,000 Units, Oral, q AM, Marvel Plan, MD, 2,000 Units at 05/24/23 0959   donepezil (ARICEPT) tablet 10 mg, 10 mg, Oral, QHS, Lehner, Erin C, NP, 10 mg at 05/23/23 2203   DULoxetine (CYMBALTA) DR capsule 90 mg, 90 mg, Oral, q AM, Marvel Plan, MD, 90 mg at 05/24/23 1610   levothyroxine (SYNTHROID) tablet 25 mcg, 25 mcg, Oral, Q0600, Hetty Blend C, NP, 25 mcg at 05/24/23 9604   Oral care mouth rinse, 15 mL, Mouth Rinse, PRN, Erick Blinks, MD   prazosin (MINIPRESS) capsule 5 mg, 5 mg, Oral, QHS, Marvel Plan, MD, 5 mg at 05/23/23 2203   senna-docusate (Senokot-S) tablet 1  tablet, 1 tablet, Oral, QHS PRN, Erick Blinks, MD  Allergies: No Known Allergies  [name], MD

## 2023-05-25 DIAGNOSIS — F332 Major depressive disorder, recurrent severe without psychotic features: Secondary | ICD-10-CM | POA: Diagnosis not present

## 2023-05-25 DIAGNOSIS — T43505A Adverse effect of unspecified antipsychotics and neuroleptics, initial encounter: Secondary | ICD-10-CM | POA: Diagnosis not present

## 2023-05-25 DIAGNOSIS — G2111 Neuroleptic induced parkinsonism: Secondary | ICD-10-CM | POA: Diagnosis not present

## 2023-05-25 MED ORDER — ENOXAPARIN SODIUM 40 MG/0.4ML IJ SOSY
40.0000 mg | PREFILLED_SYRINGE | INTRAMUSCULAR | Status: DC
  Administered 2023-05-25 – 2023-05-28 (×4): 40 mg via SUBCUTANEOUS
  Filled 2023-05-25 (×4): qty 0.4

## 2023-05-25 MED ORDER — POTASSIUM CHLORIDE CRYS ER 20 MEQ PO TBCR
20.0000 meq | EXTENDED_RELEASE_TABLET | Freq: Once | ORAL | Status: AC
  Administered 2023-05-25: 20 meq via ORAL
  Filled 2023-05-25: qty 1

## 2023-05-25 MED ORDER — LITHIUM CARBONATE 300 MG PO CAPS
450.0000 mg | ORAL_CAPSULE | Freq: Every day | ORAL | Status: DC
  Administered 2023-05-25 – 2023-05-27 (×3): 450 mg via ORAL
  Filled 2023-05-25 (×4): qty 1

## 2023-05-25 NOTE — Plan of Care (Signed)
  Problem: Education: Goal: Knowledge of secondary prevention will improve (MUST DOCUMENT ALL) Outcome: Not Progressing   Problem: Ischemic Stroke/TIA Tissue Perfusion: Goal: Complications of ischemic stroke/TIA will be minimized Outcome: Progressing

## 2023-05-25 NOTE — Progress Notes (Addendum)
 STROKE TEAM PROGRESS NOTE    SIGNIFICANT HOSPITAL EVENTS  TNK given 2/27 @ 1931  INTERIM HISTORY/SUBJECTIVE  No family at bedside.  Patient lying in bed, resting.  Eyes open to voice.  Patient oriented to place, age.  Neuroexam stable.  Discharge planning for SNF.  OBJECTIVE  CBC    Component Value Date/Time   WBC 6.5 05/24/2023 0623   RBC 4.15 (L) 05/24/2023 0623   HGB 13.3 05/24/2023 0623   HCT 40.3 05/24/2023 0623   PLT 223 05/24/2023 0623   MCV 97.1 05/24/2023 0623   MCH 32.0 05/24/2023 0623   MCHC 33.0 05/24/2023 0623   RDW 12.4 05/24/2023 0623   LYMPHSABS 1.5 05/22/2023 1909   MONOABS 0.7 05/22/2023 1909   EOSABS 0.2 05/22/2023 1909   BASOSABS 0.1 05/22/2023 1909    BMET    Component Value Date/Time   NA 140 05/24/2023 0623   NA 140 11/30/2020 0000   K 3.6 05/24/2023 0623   CL 108 05/24/2023 0623   CO2 20 (L) 05/24/2023 0623   GLUCOSE 99 05/24/2023 0623   BUN 14 05/24/2023 0623   BUN 15 11/30/2020 0000   CREATININE 1.23 05/24/2023 0623   CALCIUM 8.9 05/24/2023 0623   EGFR 60.0 12/13/2022 0822   GFRNONAA >60 05/24/2023 0623    IMAGING past 24 hours No results found.   Vitals:   05/24/23 2300 05/24/23 2347 05/25/23 0409 05/25/23 0821  BP: 120/72 102/63 126/84 113/77  Pulse: 82 79 70 96  Resp: 18 19 18    Temp: 98.7 F (37.1 C) 98.2 F (36.8 C) 97.8 F (36.6 C) 98.2 F (36.8 C)  TempSrc: Oral Oral Oral Oral  SpO2: 99% 90% 93% 93%  Weight:      Height:         PHYSICAL EXAM General:  Alert, well-nourished, well-developed patient in no acute distress Psych:  Mood and affect appropriate for situation, calm and cooperative CV: Regular rate and rhythm on monitor Respiratory:  Regular, unlabored respirations on room air GI: Abdomen soft and nontender  NEURO:  Mental Status/Speech: Patient resting but opens eyes to voice.  Continues to be oriented to self, age, place.  He does continue to have some repetition and perseveration with his answers  in conversation.  Cranial Nerves:  II: PERRL. Visual fields full.  III, IV, VI: Decreased EOM to left, but consistent blink to threat. Slowed EOM bilaterally.  V: Sensation is intact to light touch and symmetrical to face.  VII: Face is symmetrical resting and smiling VIII: hearing intact to voice. IX, X: Phonation is normal.  ZO:[id number] shrug 5/5. XII: tongue is midline without fasciculations. Motor: 5/5 strength to all muscle groups tested.  Significant bradykinesia and mild cogwheeling continues to be present no drift  Tardive dyskinesia present, baseline for years per daughter. But assessment looks more like resting tremors, R>L. Possibility for secondary parkinson's. Tone: is normal and bulk is normal Sensation- No decrease in sensation noted   Coordination: FTN intact bilaterally, HKS: no ataxia in BLE.No drift.  Gait- deferred  Most Recent NIH: 6    ASSESSMENT/PLAN  Alexander Price is a 78 y.o. male with hx of HLD, major depression, action tremors, who lives in an assisted living and brought in as a code stroke for leaning on his left, slurred speech and concern for left sided extinction and aphasia   Stroke like symptoms s/p TNK, likely stroke mimics  Code Stroke CT head negative for a large hypodensity concerning for a large  territory infarct or hyperdensity concerning for an ICH  CTA head & neck No LVO MRI: No acute intracranial abnormality. Generalized age-related cerebral atrophy with minor chronic small vessel ischemic disease 2D Echo EF 60-65% LDL 136 HgbA1c 5.6 VTE prophylaxis - lovenox No antithrombotic prior to admission, now on aspirin 81. 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 Intermittent b/l resting tremor R>L continues Significant bradykinesia with mild cog wheeling Mild rigidity and masked face Per daughter, pt walk with walker but shuffling gait and  stooped posturing Cognitive impairment on Aricept PTA Pt has been on multiple psych meds including zyprexa and lithium Was told to have "tardive dyskinesia" in the past Psychiatry on board, appreciate assistance Olanzapine stopped  Continue aricept   Hx of Depression Extensive Psych History with suicidal ideation Home meds: Ritalin, Remeron, Lithium, Zyprexa, prazosin Psych consulted and appreciate assistance Zyprexa stopped, mirtazapine lowered, cymbalta and prazosin continued Lithium level low.  Will resume lithium 450 mg nightly   Hypertension Home meds:  prazosin 5mg  at bedtime Stable On prazosin now BP goal < 180/105  [place]aman term BP goal normotensive  Hyperlipidemia Home meds:  none LDL 136, goal < 70 Add Lipitor  40mg  Continue statin at discharge  Other Stroke Risk Factors Obesity, Body mass index is 34.38 kg/m., BMI >/= 30 associated with increased stroke risk, recommend weight loss, diet and exercise as appropriate   Other Active Problems Urinary retention - s/p I/O x2, may need foley catheter if needed Hypokalemia 9.8 - 3.6, supplemented and will continue to monitor Cognitive impairment, decline for the last 4 months, especially last 2 months.  On Aricept at home, continue  Hospital day # 3   Pt seen by Neuro NP/APP and later by MD. Note/plan to be edited by MD as needed.    Lynnae January, DNP, AGACNP-BC [facility] Please use AMION for contact information & EPIC for messaging.  ATTENDING NOTE: I reviewed above note and agree with the assessment and plan. Pt was seen and examined.   Daughter at bedside.  Patient lying bed, eyes open on voice.  Daughter stated that patient did not get good sleep last night, today he was more drowsy and sleepy.  Otherwise neuro stable, no acute event overnight. Lithium level was low, restart lithium at night.  Appreciate psychiatry assistance.  Pending SNF placement.  For detailed assessment and plan, please refer  to above/below as I have made changes wherever appropriate.   Marvel Plan, MD PhD Stroke Neurology 05/25/2023 3:27 PM

## 2023-05-25 NOTE — Consult Note (Signed)
 [facility] Consult Follow-up  Patient Name: .Alexander Price  MRN: [id number]  DOB: [birth year]  Consult Order details:  Orders (From admission, onward)     Start     Ordered   05/23/23 1221  IP CONSULT TO PSYCHIATRY       Ordering Provider: Lynnae January, NP  Provider:  (Not yet assigned)  Question Answer Comment  Location [facility]   Reason for Consult? multiple psych comorbidities, extensive psych history, psych medication management      05/23/23 1223             Mode of Visit: In person    Psychiatry Consult Evaluation  Service Date: May 25, 2023 LOS:  LOS: 3 days  Chief Complaint "I feel depressed"  Primary Psychiatric Diagnoses  Major Depressive disorder, recurrent, severe without psychotic features  Assessment  Alexander Price is a 78 y.o. male admitted: Medicallyfor 05/22/2023  7:08 PM for possible stroke. He carries the psychiatric diagnoses of Major Depression and has a past medical history of hypertension, hyperlipidemia, hypothyroidism, Parkinsonism, and ongoing stroke work-up.  His current presentation of depressed mood, lack of motivation, low energy level, poor concentration which has been ongoing for years and history of ECT is most consistent with severe, recurrent, major depressive disorder, vs treatment resistance depression. He does not meet criteria for inpatient admission based on absent suicidal or homicidal ideation, intent or plan and good support from family. Current outpatient psychotropic medications include Cymbalta 90 mg daily, Lithium Carbonate 450 mg at bedtime, Mirtazapine 15 mg at bedtime, Olanzapine 2.5 mg and Prazosin 5 mg at bedtime and historically he has had a moderate response to these medications. He was compliant with medications prior to admission as evidenced by reports from the daughter. On initial examination, patient is awake and alert, with psychomotor retardation and poor eye contact. There is  intermittent resting tremors, bradykinesia, and limb rigidity. His speech is low volume, mood is "depressed" with constricted affect. No SI/HI and no perceptual abnormalities.    05/25/2023: Patient seen face to face in his hospital room for follow up. He is alert but not fully awake. Patient reports he slept well overnight but remains anhedonic, weak, with low energy level. Patient denies hallucinations, delusions, or other perceptual abnormalities. He also denies suicidal or homicidal ideation, intent or plan. Patient presents with psychomotor retardation, poor eye contact with low volume speech. His mood is "depressed" with flat affect." Of note, Lithium Level done on 05/24/2023 came out to be 0.19. As a result, lithium carbonate will be started.   Diagnoses:  Active Hospital problems: Principal Problem:   Stroke determined by clinical assessment (HCC)    Plan   ## Psychiatric Medication Recommendations:  --Continue Cymbalta 90 daily for depression --Continue Prazosin 5 mg at bedtime for nightmares --Continue Mirtazapine 7.5 mg at bedtime for sleep. --Add Lithium Carbonate 450 mg at bedtime as augmentation therapy.  --Discontinue Olanzapine due to risk of EPS.   ## Medical Decision Making Capacity: Not specifically addressed in this encounter  ## Further Work-up:  -- Lithium level, TSH, B12 and Folate level.  -- most recent EKG on 05/22/23 had QtC of 448 -- Pertinent labwork reviewed earlier this admission includes:HDL-35, LDL-136.    ## Disposition:-- There are no psychiatric contraindications to discharge at this time  ## Behavioral / Environmental: -Delirium Precautions: Delirium Interventions for Nursing and Staff: - RN to open blinds every AM. - To Bedside: Glasses, hearing aide, and pt's own  shoes. Make available to patients. when possible and encourage use. - Encourage po fluids when appropriate, keep fluids within reach. - OOB to chair with meals. - Passive ROM exercises to all  extremities with AM & PM care. - RN to assess orientation to person, time and place QAM and PRN. - Recommend extended visitation hours with familiar family/friends as feasible. - Staff to minimize disturbances at night. Turn off television when pt asleep or when not in use. or Patient would benefit from more frequent contact with medical team to delineate plan of care and allow for clarification questions, which will help alleviate anxiety regarding treatment. If possible, try to check back in with the pt in the afternoon.    ## Safety and Observation Level:  - Based on my clinical evaluation, I estimate the patient to be at low risk of self harm in the current setting. - At this time, we recommend  routine. This decision is based on my review of the chart including patient's history and current presentation, interview of the patient, mental status examination, and consideration of suicide risk including evaluating suicidal ideation, plan, intent, suicidal or self-harm behaviors, risk factors, and protective factors. This judgment is based on our ability to directly address suicide risk, implement suicide prevention strategies, and develop a safety plan while the patient is in the clinical setting. Please contact our team if there is a concern that risk level has changed.  CSSR Risk Category:C-SSRS RISK CATEGORY: No Risk  Suicide Risk Assessment: Patient has following modifiable risk factors for suicide: under treated depression , which we are addressing by prescribing medications. Patient has following non-modifiable or demographic risk factors for suicide: male gender, history of suicide attempt, and psychiatric hospitalization Patient has the following protective factors against suicide: Supportive family  Thank you for this consult request. Recommendations have been communicated to the primary team.  We will follow up at this time.   [name], MD       History of Present Illness   Relevant Aspects of [facility] Course:  Admitted on 05/22/2023 for stroke-like symptoms. Daughter reports patient has had trouble with words and some neuro decline for around 4 months, says he's had possible old mini strokes. Has been walking with walker with shuffling gait. Family was preparing to move him into ALF.   Patient Report:  Patient seen in his hospital room. He is awake, alert and oriented to place. However, patient is weak with slow response to questions. He is a poor historian, hence, most of the information were obtained from the patient's daughter at the bedside. Daughter reports patient has been suffering from severe and recurrent major depression for years and has had multiple psychiatric hospitalizations due to suicide attempts until 2019. However, daughter denies any recent hospitalization after he received electroconvulsive therapy treatment at [facility] in 2020. Daughter states patient currently follows up with a nurse practitioner at [facility] who prescribes his current medications including Cymbalta 90 md daily, Lithium Carbonate 450 mg at bedtime, Mirtazapine 15 mg at bedtime, Olanzapine 2.5 mg and Prazosin 5 mg at bedtime for nightmares.   Currently, daughter reports patient remains depressed and sleeping excessively, with lack of motivation, low energy level and poor concentration. However, daughter denies delusions, hallucinations, suicidal or homicidal ideation, intent or plan. She reports patient has underlying Parkinson-like symptoms with masked facie, intermittent resting tremors, bradykinesia, shuffling gate, limb rigidity and mild neuro cognitive deficit. He currently takes Aricept but has not seen his  Neurologist in 3 years.    Collateral information:  Most of the information obtained from the patient's daughter at bedside.   Review of Systems  Psychiatric/Behavioral:  Positive for depression. Negative for hallucinations, substance abuse and  suicidal ideas. The patient is not nervous/anxious.      Psychiatric and Social History  Psychiatric History:  Information collected from daughter.   Prev Dx/Sx: Recurrent treatment resistance depression Current Psych Provider: Misty Stanley NP at [facility].  Home Meds (current): Cymbalta 90 md daily, Lithium Carbonate 450 mg at bedtime, Mirtazapine 15 mg at bedtime, Olanzapine 2.5 mg and Prazosin 5 mg. Previous Med Trials:Unsure Therapy: denies   Prior Psych Hospitalization: Patient has had multiple hospitalizations in the past, but was last admitted in 2020. He has not been admitted since he had ECT treatment.   Prior Self Harm: Daughter reports 5 suicide attempts in the past before ECT ytreatment. She denies SI/SA in the last 3 years.  Prior Violence: denies   Family Psych History: Patient brother and sister have history of depression Family Hx suicide: denies   Social History:  Educational Hx: high school graduate Occupational Hx: retired.  Legal Hx: denies  Living Situation: Lives in an assited living home  Spiritual Hx: unknown  Access to weapons/lethal means: denies    Substance History Alcohol: denies   Type of alcohol denies  Last Drink denies  Number of drinks per day denies  History of alcohol withdrawal seizures N/A History of DT's N/A Tobacco: N/A Illicit drugs: Denies  Prescription drug abuse: denies  Rehab hx: denies   Exam Findings  Physical Exam:  Vital Signs:  Temp:  [97.8 F (36.6 C)-98.7 F (37.1 C)] 98.2 F (36.8 C) (03/02 0821) Pulse Rate:  [70-96] 96 (03/02 0821) Resp:  [15-19] 18 (03/02 0409) BP: (102-126)/(63-84) 113/77 (03/02 0821) SpO2:  [90 %-99 %] 93 % (03/02 0821) Blood pressure 113/77, pulse 96, temperature 98.2 F (36.8 C), temperature source Oral, resp. rate 18, height 6' (1.829 m), weight 115 kg, SpO2 93%. Body mass index is 34.38 kg/m.  Physical Exam  Mental Status Exam: General Appearance: Casual  Orientation:  Other:  only  to place and name  Memory:   poor  Concentration:  Concentration: Poor  Recall:  Poor  Attention  Fair  Eye Contact:  Poor  Speech:  Slow  Language:  Fair  Volume:  Decreased  Mood: "Depressed"  Affect:  Constricted  Thought Process:  NA  Thought Content:  NA  Suicidal Thoughts:  No  Homicidal Thoughts:  No  Judgement:  Fair  Insight:  Fair  Psychomotor Activity:  Psychomotor Retardation  Akathisia:  No  Fund of Knowledge:  Fair      Assets:  Communication Skills  Cognition:  Impaired,  Moderate  ADL's:  Impaired  AIMS (if indicated):        Other History   These have been pulled in through the EMR, reviewed, and updated if appropriate.  Family History:  The patient's Family history is unknown by patient.  Medical History: Past Medical History:  Diagnosis Date   Depression    sees Ellis Savage NP at [facility]    Hyperlipidemia     Surgical History: Past Surgical History:  Procedure Laterality Date   TONSILLECTOMY       Medications:   Current Facility-Administered Medications:    acetaminophen (TYLENOL) tablet 650 mg, 650 mg, Oral, Q4H PRN **OR** acetaminophen (TYLENOL) 160 MG/5ML solution 650 mg, 650 mg, Per Tube, Q4H PRN **  OR** acetaminophen (TYLENOL) suppository 650 mg, 650 mg, Rectal, Q4H PRN, Erick Blinks, MD   aspirin EC tablet 81 mg, 81 mg, Oral, Daily, Richardo Priest, Erin C, NP, 81 mg at 05/24/23 1003   atorvastatin (LIPITOR) tablet 40 mg, 40 mg, Oral, Daily, Richardo Priest, Erin C, NP, 40 mg at 05/24/23 0959   Chlorhexidine Gluconate Cloth 2 % PADS 6 each, 6 each, Topical, Q0600, Erick Blinks, MD, 6 each at 05/24/23 0600   cholecalciferol (VITAMIN D3) 25 MCG (1000 UNIT) tablet 2,000 Units, 2,000 Units, Oral, q AM, Marvel Plan, MD, 2,000 Units at 05/24/23 0959   donepezil (ARICEPT) tablet 10 mg, 10 mg, Oral, QHS, Lehner, Erin C, NP, 10 mg at 05/24/23 2103   DULoxetine (CYMBALTA) DR capsule 90 mg, 90 mg, Oral, q AM, Marvel Plan, MD, 90 mg at 05/24/23  9147   levothyroxine (SYNTHROID) tablet 25 mcg, 25 mcg, Oral, Q0600, Hetty Blend C, NP, 25 mcg at 05/25/23 0530   mirtazapine (REMERON) tablet 7.5 mg, 7.5 mg, Oral, QHS, Marvel Plan, MD, 7.5 mg at 05/24/23 2101   Oral care mouth rinse, 15 mL, Mouth Rinse, PRN, Erick Blinks, MD   prazosin (MINIPRESS) capsule 5 mg, 5 mg, Oral, QHS, Marvel Plan, MD, 5 mg at 05/24/23 2103   senna-docusate (Senokot-S) tablet 1 tablet, 1 tablet, Oral, QHS PRN, Erick Blinks, MD  Allergies: No Known Allergies  [name], MD

## 2023-05-26 DIAGNOSIS — I639 Cerebral infarction, unspecified: Secondary | ICD-10-CM | POA: Diagnosis not present

## 2023-05-26 DIAGNOSIS — R29704 NIHSS score 4: Secondary | ICD-10-CM | POA: Diagnosis not present

## 2023-05-26 LAB — BASIC METABOLIC PANEL
Anion gap: 9 (ref 5–15)
BUN: 15 mg/dL (ref 8–23)
CO2: 23 mmol/L (ref 22–32)
Calcium: 8.8 mg/dL — ABNORMAL LOW (ref 8.9–10.3)
Chloride: 106 mmol/L (ref 98–111)
Creatinine, Ser: 1.14 mg/dL (ref 0.61–1.24)
GFR, Estimated: >60 mL/min (ref >60)
Glucose, Bld: 97 mg/dL (ref 70–99)
Potassium: 3.7 mmol/L (ref 3.5–5.1)
Sodium: 138 mmol/L (ref 135–145)

## 2023-05-26 LAB — CBC
HCT: 40.8 % (ref 39.0–52.0)
Hemoglobin: 13.6 g/dL (ref 13.0–17.0)
MCH: 32.1 pg (ref 26.0–34.0)
MCHC: 33.3 g/dL (ref 30.0–36.0)
MCV: 96.2 fL (ref 80.0–100.0)
Platelets: 229 10*3/uL (ref 150–400)
RBC: 4.24 MIL/uL (ref 4.22–5.81)
RDW: 12.3 % (ref 11.5–15.5)
WBC: 6.7 10*3/uL (ref 4.0–10.5)
nRBC: 0 % (ref 0.0–0.2)

## 2023-05-26 NOTE — Plan of Care (Signed)
  Problem: Ischemic Stroke/TIA Tissue Perfusion: Goal: Complications of ischemic stroke/TIA will be minimized Outcome: Progressing   Problem: Coping: Goal: Will verbalize positive feelings about self Outcome: Progressing Goal: Will identify appropriate support needs Outcome: Progressing   Problem: Self-Care: Goal: Verbalization of feelings and concerns over difficulty with self-care will improve Outcome: Progressing Goal: Ability to communicate needs accurately will improve Outcome: Progressing   Problem: Nutrition: Goal: Risk of aspiration will decrease Outcome: Progressing Goal: Dietary intake will improve Outcome: Progressing   Problem: Clinical Measurements: Goal: Ability to maintain clinical measurements within normal limits will improve Outcome: Progressing Goal: Will remain free from infection Outcome: Progressing Goal: Diagnostic test results will improve Outcome: Progressing Goal: Respiratory complications will improve Outcome: Progressing Goal: Cardiovascular complication will be avoided Outcome: Progressing   Problem: Nutrition: Goal: Adequate nutrition will be maintained Outcome: Progressing   Problem: Coping: Goal: Level of anxiety will decrease Outcome: Progressing   Problem: Elimination: Goal: Will not experience complications related to bowel motility Outcome: Progressing   Problem: Pain Managment: Goal: General experience of comfort will improve and/or be controlled Outcome: Progressing   Problem: Safety: Goal: Ability to remain free from injury will improve Outcome: Progressing   Problem: Skin Integrity: Goal: Risk for impaired skin integrity will decrease Outcome: Progressing

## 2023-05-26 NOTE — Plan of Care (Signed)
 Pt alert and oriented x 4. Denies any pain. IV is patent and saline locked. Pt ambulating with PT/OT. Family to bedside this shift. Will continue with current plan of care.   Problem: Education: Goal: Knowledge of disease or condition will improve Outcome: Progressing Goal: Knowledge of secondary prevention will improve (MUST DOCUMENT ALL) Outcome: Progressing Goal: Knowledge of patient specific risk factors will improve (DELETE if not current risk factor) Outcome: Progressing   Problem: Ischemic Stroke/TIA Tissue Perfusion: Goal: Complications of ischemic stroke/TIA will be minimized Outcome: Progressing   Problem: Coping: Goal: Will verbalize positive feelings about self Outcome: Progressing Goal: Will identify appropriate support needs Outcome: Progressing   Problem: Health Behavior/Discharge Planning: Goal: Ability to manage health-related needs will improve Outcome: Progressing Goal: Goals will be collaboratively established with patient/family Outcome: Progressing   Problem: Self-Care: Goal: Ability to participate in self-care as condition permits will improve Outcome: Progressing Goal: Verbalization of feelings and concerns over difficulty with self-care will improve Outcome: Progressing Goal: Ability to communicate needs accurately will improve Outcome: Progressing   Problem: Nutrition: Goal: Risk of aspiration will decrease Outcome: Progressing Goal: Dietary intake will improve Outcome: Progressing   Problem: Education: Goal: Knowledge of General Education information will improve Description: Including pain rating scale, medication(s)/side effects and non-pharmacologic comfort measures Outcome: Progressing   Problem: Health Behavior/Discharge Planning: Goal: Ability to manage health-related needs will improve Outcome: Progressing   Problem: Clinical Measurements: Goal: Ability to maintain clinical measurements within normal limits will improve Outcome:  Progressing Goal: Will remain free from infection Outcome: Progressing Goal: Diagnostic test results will improve Outcome: Progressing Goal: Respiratory complications will improve Outcome: Progressing Goal: Cardiovascular complication will be avoided Outcome: Progressing   Problem: Activity: Goal: Risk for activity intolerance will decrease Outcome: Progressing   Problem: Nutrition: Goal: Adequate nutrition will be maintained Outcome: Progressing   Problem: Coping: Goal: Level of anxiety will decrease Outcome: Progressing   Problem: Elimination: Goal: Will not experience complications related to bowel motility Outcome: Progressing Goal: Will not experience complications related to urinary retention Outcome: Progressing   Problem: Pain Managment: Goal: General experience of comfort will improve and/or be controlled Outcome: Progressing   Problem: Safety: Goal: Ability to remain free from injury will improve Outcome: Progressing   Problem: Skin Integrity: Goal: Risk for impaired skin integrity will decrease Outcome: Progressing

## 2023-05-26 NOTE — Progress Notes (Signed)
 Physical Therapy Treatment Patient Details Name: Alexander Price MRN: [id number] DOB: August 28, 1945 Today's Date: 05/26/2023   History of Present Illness 78 yo male lives ALF ([facility]) L side weakness and slurred speech. NIHSS 7 PMH HLD major depression action tremors    PT Comments  Patient progressing with ambulation distance and less assistance needed for mobility.  Did have some tremor and imbalance first rising though and uncontrolled descent into the chair with high risk for falls.  Patient remains appropriate for follow up inpatient rehab prior to d/c home.     If plan is discharge home, recommend the following: A lot of help with walking and/or transfers;A lot of help with bathing/dressing/bathroom;Assist for transportation;Assistance with cooking/housework;Help with stairs or ramp for entrance   Can travel by private vehicle     Yes  Equipment Recommendations  None recommended by PT    Recommendations for Other Services       Precautions / Restrictions Precautions Precautions: Fall     Mobility  Bed Mobility Overal bed mobility: Needs Assistance Bed Mobility: Rolling, Sidelying to Sit Rolling: Min assist Sidelying to sit: Min assist, Used rails       General bed mobility comments: cues for technique, increased time, pt asking for help    Transfers Overall transfer level: Needs assistance Equipment used: Rolling walker (2 wheels) Transfers: Sit to/from Stand Sit to Stand: Min assist, Mod assist           General transfer comment: assist for safety, pt pulling up on walker, stand to sit with uncontrolled descent and cues for safety with pt trying to sit prior to chair close enough    Ambulation/Gait Ambulation/Gait assistance: Min assist Gait Distance (Feet): 40 Feet (x 2) Assistive device: Rolling walker (2 wheels) Gait Pattern/deviations: Step-to pattern, Decreased stride length, Shuffle, Trunk flexed, Wide base of support, Decreased dorsiflexion  - left, Decreased dorsiflexion - right       General Gait Details: cues and assist for walker proximity   Stairs             Wheelchair Mobility     Tilt Bed    Modified Rankin (Stroke Patients Only) Modified Rankin (Stroke Patients Only) Pre-Morbid Rankin Score: Moderate disability Modified Rankin: Moderately severe disability     Balance Overall balance assessment: Needs assistance Sitting-balance support: Feet supported Sitting balance-Leahy Scale: Fair     Standing balance support: Reliant on assistive device for balance Standing balance-Leahy Scale: Poor Standing balance comment: UE support needed with flexed posture                            Communication Communication Communication: Impaired Factors Affecting Communication: Hearing impaired  Cognition Arousal: Alert Behavior During Therapy: WFL for tasks assessed/performed   PT - Cognitive impairments: History of cognitive impairments                       PT - Cognition Comments: slow processing, decreased initiation Following commands: Intact      Cueing    Exercises      General Comments General comments (skin integrity, edema, etc.): son and wife in the room      Pertinent Vitals/Pain Pain Assessment Pain Assessment: No/denies pain    Home Living                          Prior Function  PT Goals (current goals can now be found in the care plan section) Progress towards PT goals: Progressing toward goals    Frequency    Min 1X/week      PT Plan      Co-evaluation              AM-PAC PT "6 Clicks" Mobility   Outcome Measure  Help needed turning from your back to your side while in a flat bed without using bedrails?: A Little Help needed moving from lying on your back to sitting on the side of a flat bed without using bedrails?: A Little Help needed moving to and from a bed to a chair (including a wheelchair)?: A  Little Help needed standing up from a chair using your arms (e.g., wheelchair or bedside chair)?: A Lot Help needed to walk in hospital room?: A Lot Help needed climbing 3-5 steps with a railing? : Total 6 Click Score: 14    End of Session Equipment Utilized During Treatment: Gait belt Activity Tolerance: Patient tolerated treatment well Patient left: with call bell/phone within reach;in chair;with family/visitor present   PT Visit Diagnosis: Other abnormalities of gait and mobility (R26.89);Other symptoms and signs involving the nervous system (R29.898)     Time: 6578-4696 PT Time Calculation (min) (ACUTE ONLY): 24 min  Charges:    $Gait Training: 8-22 mins $Therapeutic Activity: 8-22 mins PT General Charges $$ ACUTE PT VISIT: 1 Visit                     Sheran Lawless, PT Acute Rehabilitation Services [phone] 05/26/2023    Elray Mcgregor 05/26/2023, 3:03 PM

## 2023-05-26 NOTE — TOC Progression Note (Addendum)
 Transition of Care [facility]) - Progression Note    Patient Details  Name: Alexander Price MRN: [id number] Date of Birth: 1945/12/24  [facility] CM/SW Contact  Baldemar Lenis, [place] Phone Number: 05/26/2023, 11:03 AM  Clinical Narrative:   CSW following for SNF placement. [facility] has declined to offer a bed for patient, but [facility] is still pending. CSW requested [facility] to review referral. CSW to follow up with daughter on SNF choices.  UPDATE: [facility] declined. CSW met with patient's son, Lorin Picket, to provide update on SNF offers and answer questions. Scott to discuss with his sister and get back to CSW with SNF choice.    Expected Discharge Plan: Skilled Nursing Facility Barriers to Discharge: Continued Medical Work up, English as a second language teacher, SNF Pending bed offer  Expected Discharge Plan and Services In-house Referral: Clinical Social Work   Post Acute Care Choice: Skilled Nursing Facility Living arrangements for the past 2 months: Independent Living Facility                                       Social Determinants of Health (SDOH) Interventions SDOH Screenings   Food Insecurity: No Food Insecurity (05/22/2023)  Housing: Low Risk  (05/22/2023)  Transportation Needs: No Transportation Needs (05/22/2023)  Utilities: Not At Risk (05/22/2023)  Depression (PHQ2-9): High Risk (03/10/2023)  Financial Resource Strain: Low Risk  (05/05/2018)   Received from [facility], [facility]  Physical Activity: Insufficiently Active (05/05/2018)   Received from [facility], [facility]  Social Connections: Socially Isolated (05/22/2023)  Stress: No Stress Concern Present (05/05/2018)   [facility] from [facility], [facility]  Tobacco Use: Low Risk  (05/22/2023)  Health Literacy: Medium Risk (07/02/2020)   Received from [facility], [facility]    Readmission Risk Interventions     No data to display

## 2023-05-26 NOTE — Telephone Encounter (Signed)
 Placed into Dr Hardie Shackleton folder

## 2023-05-26 NOTE — Care Management Important Message (Signed)
 Important Message  Patient Details  Name: Alexander Price MRN: [id number] Date of Birth: 10-06-1945   Important Message Given:  Yes - Medicare IM     Dorena Bodo 05/26/2023, 3:54 PM

## 2023-05-26 NOTE — Discharge Summary (Incomplete)
 Stroke Discharge Summary  Patient ID: Alexander Price   MRN: [id number]      DOB: 17-Aug-1945  Date of Admission: 05/22/2023 Date of Discharge: 05/28/2023  Attending Physician:  Stroke, Md, MD Consultant(s):   Treatment Team:  Mariel Craft, MD  Psychiatry   Patient's PCP:  Philip Aspen, Limmie Patricia, MD  DISCHARGE PRIMARY DIAGNOSIS:  Stroke like symptoms status post TNK  Secondary Diagnoses: Parkinsonism History of depression Hypertension Hyperlipidemia Cognitive impairment  Allergies as of 05/28/2023   No Known Allergies      Medication List     STOP taking these medications    lithium carbonate 300 MG ER tablet Commonly known as: LITHOBID Replaced by: lithium carbonate 150 MG capsule   lithium carbonate 450 MG ER tablet Commonly known as: ESKALITH   OLANZapine 5 MG tablet Commonly known as: ZYPREXA       TAKE these medications    aspirin EC 81 MG tablet Take 1 tablet (81 mg total) by mouth daily. Swallow whole. Start taking on: May 29, 2023   atorvastatin 40 MG tablet Commonly known as: LIPITOR Take 1 tablet (40 mg total) by mouth daily. Start taking on: May 29, 2023   donepezil 5 MG tablet Commonly known as: ARICEPT Take 10 mg by mouth at bedtime.   DULoxetine 30 MG capsule Commonly known as: CYMBALTA Take 3 capsules (90 mg total) by mouth in the morning. Start taking on: May 29, 2023 What changed: medication strength   levothyroxine 25 MCG tablet Commonly known as: SYNTHROID Take 1 tablet (25 mcg total) by mouth daily. What changed: when to take this   lithium carbonate 150 MG capsule Take 3 capsules (450 mg total) by mouth at bedtime. Replaces: lithium carbonate 300 MG ER tablet   Melatonin 10 MG Tabs Take 1 tablet by mouth at bedtime.   methylphenidate 10 MG tablet Commonly known as: RITALIN Take 10 mg by mouth in the morning.   methylphenidate 5 MG tablet Commonly known as: RITALIN Take 5 mg by mouth daily as needed. 1  tablet at lunchtime   mirtazapine 7.5 MG tablet Commonly known as: REMERON Take 1 tablet (7.5 mg total) by mouth at bedtime. What changed:  medication strength how much to take   prazosin 5 MG capsule Commonly known as: MINIPRESS Take 5 mg by mouth at bedtime.   Vitamin D 50 MCG (2000 UT) Caps Take 1 capsule by mouth in the morning.        LABORATORY STUDIES CBC    Component Value Date/Time   WBC 7.0 05/28/2023 0639   RBC 4.22 05/28/2023 0639   HGB 13.6 05/28/2023 0639   HCT 40.8 05/28/2023 0639   PLT 246 05/28/2023 0639   MCV 96.7 05/28/2023 0639   MCH 32.2 05/28/2023 0639   MCHC 33.3 05/28/2023 0639   RDW 12.3 05/28/2023 0639   LYMPHSABS 1.5 05/22/2023 1909   MONOABS 0.7 05/22/2023 1909   EOSABS 0.2 05/22/2023 1909   BASOSABS 0.1 05/22/2023 1909   CMP    Component Value Date/Time   NA 140 05/28/2023 0639   NA 140 11/30/2020 0000   K 4.1 05/28/2023 0639   CL 105 05/28/2023 0639   CO2 23 05/28/2023 0639   GLUCOSE 98 05/28/2023 0639   BUN 21 05/28/2023 0639   BUN 15 11/30/2020 0000   CREATININE 1.23 05/28/2023 0639   CALCIUM 9.3 05/28/2023 0639   PROT 7.0 05/22/2023 1909   ALBUMIN 3.5 05/22/2023 1909  AST 44 (H) 05/22/2023 1909   ALT 52 (H) 05/22/2023 1909   ALKPHOS 81 05/22/2023 1909   BILITOT 0.5 05/22/2023 1909   GFRNONAA >60 05/28/2023 0639   COAGS Lab Results  Component Value Date   INR 1.1 05/22/2023   INR SPECIMEN CLOTTED 05/22/2023   Lipid Panel    Component Value Date/Time   CHOL 201 (H) 05/23/2023 0608   TRIG 152 (H) 05/23/2023 0608   HDL 35 (L) 05/23/2023 0608   CHOLHDL 5.7 05/23/2023 0608   VLDL 30 05/23/2023 0608   LDLCALC 136 (H) 05/23/2023 0608   HgbA1C  Lab Results  Component Value Date   HGBA1C 5.6 05/22/2023   Alcohol Level    Component Value Date/Time   ETH <10 05/22/2023 1909     SIGNIFICANT DIAGNOSTIC STUDIES MR BRAIN WO CONTRAST Result Date: 05/23/2023 CLINICAL DATA:  Initial evaluation for acute neuro  deficit, stroke suspected EXAM: MRI HEAD WITHOUT CONTRAST TECHNIQUE: Multiplanar, multiecho pulse sequences of the brain and surrounding structures were obtained without intravenous contrast. COMPARISON:  Comparison made with prior CTs from 05/22/2023 and MRI from 08/05/2021. FINDINGS: Brain: Examination moderately degraded by motion artifact. Generalized age-related cerebral atrophy. Few scattered foci of patchy T2/FLAIR hyperintensity involving the supratentorial cerebral white matter, most likely related to chronic microvascular ischemic disease, minor for age. No evidence for acute or subacute ischemia. Gray-white matter differentiation maintained. No areas of chronic cortical infarction. No visible acute or chronic intracranial blood products. No mass lesion, midline shift or mass effect. No hydrocephalus or extra-axial fluid collection. Pituitary gland within normal limits. Vascular: Major intracranial vascular flow voids are maintained. Skull and upper cervical spine: Craniocervical junction within normal limits. Bone marrow signal intensity normal. No scalp soft tissue abnormality. Sinuses/Orbits: Globes and orbital soft tissues within normal limits. Paranasal sinuses are largely clear. Moderate to large bilateral mastoid effusions. Image nasopharynx unremarkable. Other: None. IMPRESSION: 1. No acute intracranial abnormality. 2. Generalized age-related cerebral atrophy with minor chronic small vessel ischemic disease. 3. Moderate to large bilateral mastoid effusions, of uncertain significance. Correlation with physical exam recommended. Imaged nasopharynx unremarkable. Electronically Signed   By: Rise Mu M.D.   On: 05/23/2023 22:29   ECHOCARDIOGRAM COMPLETE Result Date: 05/23/2023    ECHOCARDIOGRAM REPORT   Patient Name:   Alexander Price Date of Exam: 05/23/2023 Medical Rec #:  [id number]       Height:       72.0 in Accession #:    [id number]      Weight:       253.5 lb Date of Birth:   1945/11/22      BSA:          2.357 m Patient Age:    77 years        BP:           140/68 mmHg Patient Gender: M               HR:           75 bpm. Exam Location:  Inpatient Procedure: 2D Echo, Cardiac Doppler, Color Doppler and Intracardiac            Opacification Agent (Both Spectral and Color Flow Doppler were            utilized during procedure). Indications:    Stroke  History:        Patient has no prior history of Echocardiogram examinations.  Risk Factors:HLD.  Sonographer:    Webb Laws Referring Phys: [id number] SALMAN KHALIQDINA IMPRESSIONS  1. Left ventricular ejection fraction, by estimation, is 60 to 65%. The left ventricle has normal function. The left ventricle has no regional wall motion abnormalities. Left ventricular diastolic parameters are consistent with Grade I diastolic dysfunction (impaired relaxation).  2. Right ventricular systolic function is normal. The right ventricular size is normal. Tricuspid regurgitation signal is inadequate for assessing PA pressure.  3. The mitral valve is normal in structure. No evidence of mitral valve regurgitation. No evidence of mitral stenosis.  4. The aortic valve is tricuspid. There is mild calcification of the aortic valve. Aortic valve regurgitation is not visualized. No aortic stenosis is present.  5. IVC not visualized. FINDINGS  Left Ventricle: Left ventricular ejection fraction, by estimation, is 60 to 65%. The left ventricle has normal function. The left ventricle has no regional wall motion abnormalities. The left ventricular internal cavity size was normal in size. There is  no left ventricular hypertrophy. Left ventricular diastolic parameters are consistent with Grade I diastolic dysfunction (impaired relaxation). Right Ventricle: The right ventricular size is normal. No increase in right ventricular wall thickness. Right ventricular systolic function is normal. Tricuspid regurgitation signal is inadequate for assessing  PA pressure. Left Atrium: Left atrial size was normal in size. Right Atrium: Right atrial size was normal in size. Pericardium: There is no evidence of pericardial effusion. Mitral Valve: The mitral valve is normal in structure. No evidence of mitral valve regurgitation. No evidence of mitral valve stenosis. Tricuspid Valve: The tricuspid valve is normal in structure. Tricuspid valve regurgitation is trivial. Aortic Valve: The aortic valve is tricuspid. There is mild calcification of the aortic valve. Aortic valve regurgitation is not visualized. No aortic stenosis is present. Pulmonic Valve: The pulmonic valve was not well visualized. Pulmonic valve regurgitation is not visualized. Aorta: The aortic root is normal in size and structure. Venous: IVC not visualized. The inferior vena cava was not well visualized. IAS/Shunts: No atrial level shunt detected by color flow Doppler.  LEFT VENTRICLE PLAX 2D LVIDd:         4.50 cm   Diastology LVIDs:         2.50 cm   LV e' medial:    6.84 cm/s LV PW:         0.90 cm   LV E/e' medial:  9.6 LV IVS:        1.00 cm   LV e' lateral:   11.50 cm/s LVOT diam:     2.10 cm   LV E/e' lateral: 5.7 LV SV:         58 LV SV Index:   25 LVOT Area:     3.46 cm  RIGHT VENTRICLE RV Basal diam:  4.10 cm RV S prime:     19.70 cm/s TAPSE (M-mode): 3.0 cm LEFT ATRIUM           Index       RIGHT ATRIUM           Index LA diam:      2.50 cm 1.06 cm/m  RA Area:     14.50 cm LA Vol (A4C): 19.6 ml 8.32 ml/m  RA Volume:   30.20 ml  12.82 ml/m  AORTIC VALVE LVOT Vmax:   107.00 cm/s LVOT Vmean:  70.300 cm/s LVOT VTI:    0.168 m  AORTA Ao Root diam: 3.10 cm MITRAL VALVE MV Area (PHT): 3.60 cm  SHUNTS MV Decel Time: 211 msec    Systemic VTI:  0.17 m MV E velocity: 65.50 cm/s  Systemic Diam: 2.10 cm MV A velocity: 80.00 cm/s MV E/A ratio:  0.82 Dalton McleanMD Electronically signed by Wilfred Lacy Signature Date/Time: 05/23/2023/12:45:34 PM    Final    CT ANGIO HEAD NECK W WO CM (CODE  STROKE) Result Date: 05/22/2023 CLINICAL DATA:  Initial evaluation for acute neuro deficit, stroke. EXAM: CT ANGIOGRAPHY HEAD AND NECK WITH AND WITHOUT CONTRAST TECHNIQUE: Multidetector CT imaging of the head and neck was performed using the standard protocol during bolus administration of intravenous contrast. Multiplanar CT image reconstructions and MIPs were obtained to evaluate the vascular anatomy. Carotid stenosis measurements (when applicable) are obtained utilizing NASCET criteria, using the distal internal carotid diameter as the denominator. RADIATION DOSE REDUCTION: This exam was performed according to the departmental dose-optimization program which includes automated exposure control, adjustment of the mA and/or kV according to patient size and/or use of iterative reconstruction technique. CONTRAST:  75mL OMNIPAQUE IOHEXOL 350 MG/ML SOLN COMPARISON:  CT from earlier the same day. FINDINGS: CTA NECK FINDINGS Aortic arch: Standard branching. Imaged portion shows no evidence of aneurysm or dissection. No significant stenosis of the major arch vessel origins. Right carotid system: Right common and internal carotid arteries are patent without dissection. Mild atheromatous change about the right carotid bulb without hemodynamically significant stenosis. Left carotid system: Left common and internal carotid arteries are patent without dissection. Mild atheromatous irregularity about the left carotid bulb without hemodynamically significant stenosis. Vertebral arteries: Both vertebral arteries arise from subclavian arteries. Vertebral arteries are patent without stenosis or dissection. Skeleton: No discrete or worrisome osseous lesions. Prominent osteoarthritic changes noted about the C1-2 articulations. Underlying cervical spondylosis, most pronounced at C4-5 through C6-7. Other neck: No other acute finding. Upper chest: Scattered atelectatic changes noted within the visualized lungs. 7 mm pleural base nodule  at the posterior right upper lobe (series 5, image 18), indeterminate. Review of the MIP images confirms the above findings CTA HEAD FINDINGS Anterior circulation: Both internal carotid arteries are patent to the termini without stenosis. 4 mm saccular aneurysm seen extending posteriorly and medially from the cavernous left ICA (series 7, image 287). A1 segments patent bilaterally. Normal anterior communicating artery complex. Anterior cerebral arteries patent without stenosis. No M1 stenosis or occlusion. Distal MCA branches perfused and symmetric. Posterior circulation: Both V4 segments patent without stenosis. Both PICA patent. Basilar patent without stenosis. Superior cerebral arteries patent bilaterally. Left PCA supplied via the basilar. Predominant fetal type origin of the right PCA. Both PCAs patent without stenosis. Venous sinuses: Patent allowing for timing the contrast bolus right transverse sinus is markedly hypoplastic. Anatomic variants: As above. Review of the MIP images confirms the above findings IMPRESSION: 1. Negative CTA for large vessel occlusion or other emergent finding. 2. Mild atheromatous change about the carotid bifurcations without hemodynamically significant stenosis. 3. 4 mm cavernous left ICA aneurysm. 4. 7 mm pleural based nodule at the posterior right upper lobe, indeterminate. Per Fleischner Society Guidelines, recommend a non-contrast Chest CT at 6-12 months. If patient is high risk for malignancy, consider an additional non-contrast Chest CT at 18-24 months. If patient is low risk for malignancy, non-contrast Chest CT at 18-24 months is optional. These guidelines do not apply to immunocompromised patients and patients with cancer. Follow up in patients with significant comorbidities as clinically warranted. For lung cancer screening, adhere to Lung-RADS guidelines. Reference: Radiology. 2017; 284(1):228-43. Case discussed by telephone at  the time of interpretation on 05/22/2023 at  7:43 pm to provider [facility]. Electronically Signed   By: Rise Mu M.D.   On: 05/22/2023 19:44   CT HEAD CODE STROKE WO CONTRAST Result Date: 05/22/2023 CLINICAL DATA:  Code stroke. Initial evaluation for acute neuro deficit, stroke. EXAM: CT HEAD WITHOUT CONTRAST TECHNIQUE: Contiguous axial images were obtained from the base of the skull through the vertex without intravenous contrast. RADIATION DOSE REDUCTION: This exam was performed according to the departmental dose-optimization program which includes automated exposure control, adjustment of the mA and/or kV according to patient size and/or use of iterative reconstruction technique. COMPARISON:  MRI from 08/05/2021 FINDINGS: Brain: Cerebral volume within normal limits for age. Mild chronic microvascular ischemic disease. No acute intracranial hemorrhage. No acute large vessel territory infarct. No mass lesion or midline shift. No hydrocephalus or extra-axial fluid collection. Vascular: No abnormal hyperdense vessel. Scattered vascular calcifications noted within the carotid siphons. Skull: Scalp soft tissues and calvarium within normal limits. Sinuses/Orbits: Globes orbital soft tissues within normal limits. Paranasal sinuses are largely clear. Large left mastoid and middle ear effusion. Trace right mastoid effusion. Other: None. ASPECTS [facility] Stroke Program Early CT Score) - Ganglionic level infarction (caudate, lentiform nuclei, internal capsule, insula, M1-M3 cortex): 7 - Supraganglionic infarction (M4-M6 cortex): 3 Total score (0-10 with 10 being normal): 10 IMPRESSION: 1. No acute intracranial abnormality. 2. ASPECTS is 10. 3. Mild chronic microvascular ischemic disease. 4. Large left mastoid and middle ear effusion, of uncertain significance. Correlation with physical exam recommended. These results were communicated to Dr. Derry Lory at 7:25 pm on 05/22/2023 by text page via the [facility] messaging system. Electronically Signed    By: Rise Mu M.D.   On: 05/22/2023 19:26       HISTORY OF PRESENT ILLNESS 78 y.o. patient with history of hyperlipidemia, depression, parkinsonism and action tremors was admitted with slurred speech, left-sided extinction, aphasia and leaning to the left.  HOSPITAL COURSE Patient's symptoms were thought to be due to a stroke, and he was given TNK to treat this.  However, MRI was negative for acute infarct.  He was seen by psychiatry, and Zyprexa was discontinued due to excessive side effects.  Lithium dose was also adjusted.  Stroke like symptoms s/p TNK, likely stroke mimics  Code Stroke CT head negative for a large hypodensity concerning for a large territory infarct or hyperdensity concerning for an ICH  CTA head & neck No LVO MRI: No acute intracranial abnormality. Generalized age-related cerebral atrophy with minor chronic small vessel ischemic disease 2D Echo EF 60-65% LDL 136 HgbA1c 5.6 VTE prophylaxis - lovenox No antithrombotic prior to admission, now on aspirin 81. 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 Lithium 450 mg qHS  Psych assistance appreciated TSH high, but free T4 normal range  and FT3 pending  Hypertension Home meds:  prazosin 5mg  at bedtime Stable On prazosin now BP goal < 180/105  Steinke term BP goal normotensive   Hyperlipidemia Home meds:  none LDL 136, goal < 70 Add Lipitor  40mg  Continue statin at discharge   Other Stroke Risk Factors Obesity, Body mass index is 34.38 kg/m., BMI >/= 30 associated with increased stroke risk, recommend weight loss, diet and exercise as appropriate    Other Active Problems Urinary retention - s/p I/O x2, may need foley catheter if needed Hypokalemia 9.8 - 3.6, supplemented and will continue to monitor Cognitive impairment, decline for the last 4 months, especially last 2 months.  On Aricept at home, continue   DISCHARGE EXAM  PHYSICAL EXAM General:  Alert, well-nourished, well-developed patient in no acute distress Psych:  Mood and affect appropriate for situation Respiratory:  Regular, unlabored respirations on room air  NEURO:  Mental Status: AA&Ox3  Speech/Language: speech is with mild dysarthria.  Naming, repetition, fluency, and comprehension intact.  Cranial Nerves:  II: PERRL. Visual fields full.  III, IV, VI: EOMI. Eyelids elevate symmetrically.  V: Sensation is intact to light touch and symmetrical to face.  VII: Smile is symmetrical.  VIII: hearing intact to voice. IX, X: Is mildly dysarthric [id number] shrug 5/5. XII: tongue is midline without fasciculations. Motor: Able to move all 4 extremities with good antigravity strength Tone: is normal and bulk is normal Sensation- Intact to light touch bilaterally.  Coordination: FTN intact bilaterally Gait- deferred  1a Level of Conscious.: 0 1b LOC Questions: 0 1c LOC Commands: 0 2 Best Gaze: 0 3 Visual: 0 4 Facial Palsy: 0 5a Motor Arm - left: 0 5b Motor Arm - Right: 0 6a Motor Leg - Left: 0 6b Motor Leg - Right: 0 7 Limb Ataxia: 0 8 Sensory: 0 9 Best Language: 0 10 Dysarthria: 1 11 Extinct. and Inatten.: 0 TOTAL: 1   Discharge  Diet       Diet   Diet Heart Room service appropriate? Yes with Assist; Fluid consistency: Thin   liquids  DISCHARGE PLAN Disposition: Skilled nursing facility aspirin 81 mg daily for secondary stroke prevention  Ongoing stroke risk factor control by Primary Care Physician at time of discharge Follow-up PCP Philip Aspen, Limmie Patricia, MD in 2 weeks. Follow up with psychiatry as needed Follow-up in [facility] in 4-6 weeks, office to schedule an appointment.   35 minutes were spent preparing discharge. Cortney E Ernestina Columbia , MSN, AGACNP-BC [facility] See Amion for schedule and pager information 05/28/2023 2:48 PM   ATTENDING NOTE: I reviewed above note and agree with the assessment and plan. Pt was seen and examined.   No acute event overnight, neuro stable.  Constipation put on suppository.  Medically ready for SNF placement.  Follow-up at [facility] in 4 to 6 weeks.  For detailed assessment and plan, please refer to above/below as I have made changes wherever appropriate.   Marvel Plan, MD PhD Stroke Neurology 05/28/2023 10:52 PM

## 2023-05-26 NOTE — Progress Notes (Addendum)
 STROKE TEAM PROGRESS NOTE    SIGNIFICANT HOSPITAL EVENTS  TNK given 2/27 @ 1931  INTERIM HISTORY/SUBJECTIVE  Sitting in bed, brushing teeth. Awaiting d/c to SNF. Neuro exam stable, hemodynamically stable.   OBJECTIVE  CBC    Component Value Date/Time   WBC 6.7 05/26/2023 0644   RBC 4.24 05/26/2023 0644   HGB 13.6 05/26/2023 0644   HCT 40.8 05/26/2023 0644   PLT 229 05/26/2023 0644   MCV 96.2 05/26/2023 0644   MCH 32.1 05/26/2023 0644   MCHC 33.3 05/26/2023 0644   RDW 12.3 05/26/2023 0644   LYMPHSABS 1.5 05/22/2023 1909   MONOABS 0.7 05/22/2023 1909   EOSABS 0.2 05/22/2023 1909   BASOSABS 0.1 05/22/2023 1909    BMET    Component Value Date/Time   NA 138 05/26/2023 0644   NA 140 11/30/2020 0000   K 3.7 05/26/2023 0644   CL 106 05/26/2023 0644   CO2 23 05/26/2023 0644   GLUCOSE 97 05/26/2023 0644   BUN 15 05/26/2023 0644   BUN 15 11/30/2020 0000   CREATININE 1.14 05/26/2023 0644   CALCIUM 8.8 (L) 05/26/2023 0644   EGFR 60.0 12/13/2022 0822   GFRNONAA >60 05/26/2023 0644    IMAGING past 24 hours No results found.   Vitals:   05/25/23 1942 05/25/23 2323 05/26/23 0358 05/26/23 0752  BP: 116/76 134/81 (!) 129/92 (!) 141/89  Pulse: 65 72 70 (!) 58  Resp: 18 18 18 18   Temp: 98.2 F (36.8 C) (!) 97.5 F (36.4 C) (!) 97.4 F (36.3 C) 97.6 F (36.4 C)  TempSrc: Oral Oral Oral Oral  SpO2: 92% 90% 96% 91%  Weight:      Height:         PHYSICAL EXAM General:  Alert, well-nourished, well-developed patient in no acute distress Psych:  Mood and affect appropriate for situation, calm and cooperative CV: Regular rate and rhythm on monitor Respiratory:  Regular, unlabored respirations on room air GI: Abdomen soft and nontender  NEURO:  Mental Status/Speech: Patient resting but opens eyes to voice.  Continues to be oriented to self, age, place.  He does continue to have some repetition and perseveration with his answers in conversation.  Cranial Nerves:  II:  PERRL. Visual fields full.  III, IV, VI: Decreased EOM to left, but consistent blink to threat. Slowed EOM bilaterally.  V: Sensation is intact to light touch and symmetrical to face.  VII: Face is symmetrical resting and smiling VIII: hearing intact to voice. IX, X: Phonation is normal.  ZO:XWRUEAVW shrug 5/5. XII: tongue is midline without fasciculations. Motor: 5/5 strength to all muscle groups tested.  Significant bradykinesia and mild cogwheeling continues to be present no drift  Tardive dyskinesia present, baseline for years per daughter. But assessment looks more like resting tremors, R>L. Possibility for secondary parkinson's. Tone: is normal and bulk is normal Sensation- No decrease in sensation noted   Coordination: FTN intact bilaterally, HKS: no ataxia in BLE.No drift.  Gait- deferred  Most Recent NIH: 6    ASSESSMENT/PLAN  Alexander Price is a 78 y.o. male with hx of HLD, major depression, action tremors, who lives in an assisted living and brought in as a code stroke for leaning on his left, slurred speech and concern for left sided extinction and aphasia   Stroke like symptoms s/p TNK, likely stroke mimics  Code Stroke CT head negative for a large hypodensity concerning for a large territory infarct or hyperdensity concerning for an ICH  CTA  head & neck No LVO MRI: No acute intracranial abnormality. Generalized age-related cerebral atrophy with minor chronic small vessel ischemic disease 2D Echo EF 60-65% LDL 136 HgbA1c 5.6 VTE prophylaxis - lovenox No antithrombotic prior to admission, now on aspirin 81. 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 Intermittent b/l resting tremor R>L continues Significant bradykinesia with mild cog wheeling Mild rigidity and masked face Per daughter, pt walk with walker but shuffling gait and stooped posturing Cognitive impairment on  Aricept PTA Pt has been on multiple psych meds including zyprexa and lithium Was told to have "tardive dyskinesia" in the past Psychiatry on board, appreciate assistance Olanzapine stopped  Continue aricept   Hx of Depression Extensive Psych History with suicidal ideation Home meds: Ritalin, Remeron, Lithium, Zyprexa, prazosin Psych consulted and appreciate assistance Zyprexa stopped, mirtazapine lowered, cymbalta and prazosin continued Lithium level low. Lithium 450 mg HS   Hypertension Home meds:  prazosin 5mg  at bedtime Stable On prazosin now BP goal < 180/105  Kopinski term BP goal normotensive  Hyperlipidemia Home meds:  none LDL 136, goal < 70 Add Lipitor  40mg  Continue statin at discharge  Other Stroke Risk Factors Obesity, Body mass index is 34.38 kg/m., BMI >/= 30 associated with increased stroke risk, recommend weight loss, diet and exercise as appropriate   Other Active Problems Urinary retention - s/p I/O x2, may need foley catheter if needed Hypokalemia 9.8 - 3.6, supplemented and will continue to monitor Cognitive impairment, decline for the last 4 months, especially last 2 months.  On Aricept at home, continue  Hospital day # 4   Patient seen and examined by NP/APP with MD. MD to update note as needed.   Alexander Picker, DNP, FNP-BC [facility] Pager: [phone]   ATTENDING NOTE: I reviewed above note and agree with the assessment and plan. Pt was seen and examined.   No family at bedside.  Patient neuro stable, unchanged.  On aspirin and statin.  Pending SNF placement.  Continue current medication.  For detailed assessment and plan, please refer to above/below as I have made changes wherever appropriate.   Marvel Plan, MD PhD Stroke Neurology 05/26/2023 6:26 PM

## 2023-05-27 DIAGNOSIS — I639 Cerebral infarction, unspecified: Secondary | ICD-10-CM | POA: Diagnosis not present

## 2023-05-27 DIAGNOSIS — R29704 NIHSS score 4: Secondary | ICD-10-CM | POA: Diagnosis not present

## 2023-05-27 NOTE — Progress Notes (Addendum)
 STROKE TEAM PROGRESS NOTE    SIGNIFICANT HOSPITAL EVENTS TNK given 2/27 @ 1931  INTERIM HISTORY/SUBJECTIVE Awaiting d/c to SNF. Neuro exam stable, hemodynamically stable.  Sitting up in the chair today. States he's been tired today, was only able to walk a bit with therapy before getting too tired. Using walker to ambulate  OBJECTIVE  CBC    Component Value Date/Time   WBC 6.7 05/26/2023 0644   RBC 4.24 05/26/2023 0644   HGB 13.6 05/26/2023 0644   HCT 40.8 05/26/2023 0644   PLT 229 05/26/2023 0644   MCV 96.2 05/26/2023 0644   MCH 32.1 05/26/2023 0644   MCHC 33.3 05/26/2023 0644   RDW 12.3 05/26/2023 0644   LYMPHSABS 1.5 05/22/2023 1909   MONOABS 0.7 05/22/2023 1909   EOSABS 0.2 05/22/2023 1909   BASOSABS 0.1 05/22/2023 1909    BMET    Component Value Date/Time   NA 138 05/26/2023 0644   NA 140 11/30/2020 0000   K 3.7 05/26/2023 0644   CL 106 05/26/2023 0644   CO2 23 05/26/2023 0644   GLUCOSE 97 05/26/2023 0644   BUN 15 05/26/2023 0644   BUN 15 11/30/2020 0000   CREATININE 1.14 05/26/2023 0644   CALCIUM 8.8 (L) 05/26/2023 0644   EGFR 60.0 12/13/2022 0822   GFRNONAA >60 05/26/2023 0644    IMAGING past 24 hours No results found.   Vitals:   05/26/23 1949 05/26/23 2348 05/27/23 0326 05/27/23 1101  BP: 129/76 93/60 115/79 129/69  Pulse: 77 73 62 64  Resp: 18 16 18 18   Temp: 98.5 F (36.9 C) 98.6 F (37 C) 97.8 F (36.6 C) 97.8 F (36.6 C)  TempSrc: Oral   Oral  SpO2: 92% 96% 95% 92%  Weight:      Height:         PHYSICAL EXAM General:  Alert, well-nourished, well-developed patient in no acute distress Psych:  Mood and affect appropriate for situation, calm and cooperative CV: Regular rate and rhythm on monitor Respiratory:  Regular, unlabored respirations on room air GI: Abdomen soft and nontender  NEURO:  Mental Status/Speech: Patient resting but opens eyes to voice.  Continues to be oriented to self, age, place.  He does continue to have some  repetition and perseveration with his answers in conversation.  Cranial Nerves:  II: PERRL. Visual fields full.  III, IV, VI: Decreased EOM to left, but consistent blink to threat. Slowed EOM bilaterally.  V: Sensation is intact to light touch and symmetrical to face.  VII: Face is symmetrical resting and smiling VIII: hearing intact to voice. IX, X: Phonation is normal.  ZO:XWRUEAVW shrug 5/5. XII: tongue is midline without fasciculations. Motor: 5/5 strength to all muscle groups tested.  Significant bradykinesia and mild cogwheeling continues to be present no drift  Tardive dyskinesia present, baseline for years per daughter. But assessment looks more like resting tremors, R>L. Possibility for secondary parkinson's. Tone: is normal and bulk is normal Sensation- No decrease in sensation noted   Coordination: FTN intact bilaterally, HKS: no ataxia in BLE.No drift.  Gait- deferred  Most Recent NIH: 6    ASSESSMENT/PLAN  Alexander Price is a 78 y.o. male with hx of HLD, major depression, action tremors, who lives in an assisted living and brought in as a code stroke for leaning on his left, slurred speech and concern for left sided extinction and aphasia   Stroke like symptoms s/p TNK, likely stroke mimics  Code Stroke CT head negative for a  large hypodensity concerning for a large territory infarct or hyperdensity concerning for an ICH  CTA head & neck No LVO MRI: No acute intracranial abnormality. Generalized age-related cerebral atrophy with minor chronic small vessel ischemic disease 2D Echo EF 60-65% LDL 136 HgbA1c 5.6 VTE prophylaxis - lovenox No antithrombotic prior to admission, now on aspirin 81. 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 Intermittent b/l resting tremor R>L continues Significant bradykinesia with mild cog wheeling Mild rigidity and masked face Per daughter,  pt walk with walker but shuffling gait and stooped posturing Cognitive impairment on Aricept PTA Pt has been on multiple psych meds including zyprexa and lithium Was told to have "tardive dyskinesia" in the past Psychiatry on board, appreciate assistance Olanzapine stopped  Continue aricept   Hx of Depression Extensive Psych History with suicidal ideation Home meds: Ritalin, Remeron, Lithium, Zyprexa, prazosin Psych consulted and appreciate assistance Zyprexa stopped, mirtazapine lowered, cymbalta and prazosin continued Lithium level low. Lithium 450 mg qHS  Lithium recheck pending  Psych assistance appreciated TSH, free T3 and T4 pending  Hypertension Home meds:  prazosin 5mg  at bedtime Stable On prazosin now BP goal < 180/105  Bronder term BP goal normotensive  Hyperlipidemia Home meds:  none LDL 136, goal < 70 Add Lipitor  40mg  Continue statin at discharge  Other Stroke Risk Factors Obesity, Body mass index is 34.38 kg/m., BMI >/= 30 associated with increased stroke risk, recommend weight loss, diet and exercise as appropriate   Other Active Problems Urinary retention - s/p I/O x2, may need foley catheter if needed Hypokalemia 9.8 - 3.6, supplemented and will continue to monitor Cognitive impairment, decline for the last 4 months, especially last 2 months.  On Aricept at home, continue  Hospital day # 5   Patient seen and examined by NP/APP with MD. MD to update note as needed.   Elmer Picker, DNP, FNP-BC [facility] Pager: [phone]  ATTENDING NOTE: I reviewed above note and agree with the assessment and plan. Pt was seen and examined.   No acute event overnight, neuro stable.  Pending SNF.  Continue aspirin and Lipitor.  Psychiatry on board, repeat lithium level in the a.m., check TSH, free T3 and T4 in AM.  For detailed assessment and plan, please refer to above/below as I have made changes wherever appropriate.   Marvel Plan, MD PhD Stroke  Neurology 05/27/2023 7:03 PM

## 2023-05-27 NOTE — Progress Notes (Signed)
 Occupational Therapy Treatment Patient Details Name: Alexander Price MRN: [id number] DOB: [birth year] Today's Date: 05/27/2023   History of present illness 78 yo male lives ALF ([facility]) L side weakness and slurred speech. NIHSS 7 PMH HLD major depression action tremors   OT comments  Pt demonstrates grooming task sitting support with cues for initiation task. Pt completed basic transfer with RW until fatigued and sitting in chair. Pt needs cues and (A) to control descend to chair surface. Pt benefits from skilled inpatient follow up therapy, <3 hours/day.       If plan is discharge home, recommend the following:  Two people to help with walking and/or transfers;Two people to help with bathing/dressing/bathroom   Equipment Recommendations  BSC/3in1;Wheelchair (measurements OT);Wheelchair cushion (measurements OT);Hospital bed    Recommendations for Other Services      Precautions / Restrictions Precautions Precautions: Fall Restrictions Weight Bearing Restrictions Per Provider Order: No       Mobility Bed Mobility Overal bed mobility: Needs Assistance Bed Mobility: Supine to Sit Rolling: Min assist   Supine to sit: Min assist     General bed mobility comments: pt with mod cues to sequence task and able to roll onto R side and push up with R UE. pt able to come to static sitting eob supervision    Transfers Overall transfer level: Needs assistance Equipment used: Rolling walker (2 wheels) Transfers: Sit to/from Stand Sit to Stand: Min assist           General transfer comment: pt able to power up from bed surface with max cues to place one hand on surface and pt putting bil hands on the RW. pt with cues to keep close distance to RW . pt return demonstrates     Balance Overall balance assessment: Needs assistance Sitting-balance support: No upper extremity supported, Feet supported Sitting balance-Leahy Scale: Good     Standing balance support:  Bilateral upper extremity supported, During functional activity, Reliant on assistive device for balance Standing balance-Leahy Scale: Poor               High level balance activites: Direction changes, Turns             ADL either performed or assessed with clinical judgement   ADL Overall ADL's : Needs assistance/impaired   Eating/Feeding Details (indicate cue type and reason): tray completed with good PO intake indicated . pt was setup and completed self feeding Grooming: Contact guard assist;Sitting Grooming Details (indicate cue type and reason): mod cues to initiate task. pt reports oh i did this and the tooth brush is dry indicating he has not brushed his teeth in the last few hours. pt needs multiple cues to initiate. pt increased time and time. pt could benefit from electric tooth brush to help with overall hygiene / grasp on brush                 Toilet Transfer: Minimal assistance;Rolling walker (2 wheels) Toilet Transfer Details (indicate cue type and reason): cues for placement in RW. cues for controlling descend. simulated with chair transfer         Functional mobility during ADLs: Minimal assistance;Rolling walker (2 wheels) General ADL Comments: pt supine in bed with head at the middle of the bed as it appears and pt communicates that he was trying to lay down from sitting eob to eat.    Extremity/Trunk Assessment Upper Extremity Assessment Upper Extremity Assessment: Overall WFL for tasks assessed   Lower Extremity  Assessment Lower Extremity Assessment: Defer to PT evaluation        Vision   Additional Comments: has glasses in room. pt asked for glasses but then doesnt wear them   Perception     Praxis     Communication Communication Communication: Impaired Factors Affecting Communication: Hearing impaired   Cognition Arousal: Alert Behavior During Therapy: WFL for tasks assessed/performed Cognition: Cognition impaired     Awareness:  Intellectual awareness impaired, Online awareness impaired Memory impairment (select all impairments): Working memory                       Following commands: Theatre stage manager Comments      Pertinent Vitals/ Pain       Pain Assessment Pain Assessment: No/denies pain  Home Living                                          Prior Functioning/Environment              Frequency  Min 2X/week        Progress Toward Goals  OT Goals(current goals can now be found in the care plan section)  Progress towards OT goals: Progressing toward goals  Acute Rehab OT Goals Patient Stated Goal: to sit up OT Goal Formulation: With patient/family Time For Goal Achievement: 06/06/23 Potential to Achieve Goals: Good  Plan      Co-evaluation                 AM-PAC OT "6 Clicks" Daily Activity     Outcome Measure   Help from another person eating meals?: A Little Help from another person taking care of personal grooming?: A Lot Help from another person toileting, which includes using toliet, bedpan, or urinal?: A Lot Help from another person bathing (including washing, rinsing, drying)?: A Lot Help from another person to put on and taking off regular upper body clothing?: A Lot Help from another person to put on and taking off regular lower body clothing?: A Lot 6 Click Score: 13    End of Session Equipment Utilized During Treatment: Gait belt;Rolling walker (2 wheels)  OT Visit Diagnosis: Unsteadiness on feet (R26.81);Muscle weakness (generalized) (M62.81)   Activity Tolerance Patient tolerated treatment well   Patient Left in chair;with call bell/phone within reach (no chair alarm available and RN notified in chair without alarm.)   Nurse Communication Mobility status;Precautions        Time: 6578-4696 OT Time Calculation (min): 16 min  Charges: OT General Charges $OT  Visit: 1 Visit OT Treatments $Self Care/Home Management : 8-22 mins   Brynn, OTR/L  Acute Rehabilitation Services Office: [phone] .   Mateo Flow 05/27/2023, 11:41 AM

## 2023-05-27 NOTE — TOC Progression Note (Addendum)
 Transition of Care [facility]) - Progression Note    Patient Details  Name: Alexander Price MRN: [id number] Date of Birth: 1945-05-31  Transition of Care [facility]) CM/SW Contact  Baldemar Lenis, [place] Phone Number: 05/27/2023, 11:56 AM  Clinical Narrative:   CSW received update from patient's son and daughter that they would be interested in [place], [place], [place], or [employer]. Patient received bed offer from [facility], CSW confirmed bed would be available tomorrow. CSW updated family, they are in agreement. CSW answered questions about insurance coverage. CSW requested CMA to initiate insurance authorization for SNF. CSW to follow.  UPDATE: Patient received insurance approval to admit to SNF tomorrow. CSW updated [facility] and patient's children. CSW to follow.    Expected Discharge Plan: Skilled Nursing Facility Barriers to Discharge: Continued Medical Work up, English as a second language teacher, SNF Pending bed offer  Expected Discharge Plan and Services In-house Referral: Clinical Social Work   Post Acute Care Choice: Skilled Nursing Facility Living arrangements for the past 2 months: Independent Living Facility                                       Social Determinants of Health (SDOH) Interventions SDOH Screenings   Food Insecurity: No Food Insecurity (05/22/2023)  Housing: Low Risk  (05/22/2023)  Transportation Needs: No Transportation Needs (05/22/2023)  Utilities: Not At Risk (05/22/2023)  Depression (PHQ2-9): High Risk (03/10/2023)  Financial Resource Strain: Low Risk  (05/05/2018)   [facility], [facility]  Physical Activity: Insufficiently Active (05/05/2018)   Received from [facility], [facility]  Social Connections: Socially Isolated (05/22/2023)  Stress: No Stress Concern Present (05/05/2018)   Received from [facility], [facility]  Tobacco Use: Low Risk  (05/22/2023)  Health Literacy: Medium Risk (07/02/2020)    Received from [facility], [facility]    Readmission Risk Interventions     No data to display

## 2023-05-27 NOTE — Plan of Care (Signed)
  Problem: Education: Goal: Knowledge of disease or condition will improve Outcome: Progressing   Problem: Nutrition: Goal: Risk of aspiration will decrease Outcome: Progressing   Problem: Clinical Measurements: Goal: Ability to maintain clinical measurements within normal limits will improve Outcome: Progressing   Problem: Activity: Goal: Risk for activity intolerance will decrease Outcome: Progressing   Problem: Elimination: Goal: Will not experience complications related to bowel motility Outcome: Progressing Goal: Will not experience complications related to urinary retention Outcome: Progressing   Problem: Pain Managment: Goal: General experience of comfort will improve and/or be controlled Outcome: Progressing   Problem: Safety: Goal: Ability to remain free from injury will improve Outcome: Progressing

## 2023-05-27 NOTE — Consult Note (Signed)
 [facility] Consult Follow-up  Patient Name: .Alexander Price  MRN: [id number]  DOB: December 14, 1945  Consult Order details:  Orders (From admission, onward)     Start     Ordered   05/23/23 1221  IP CONSULT TO PSYCHIATRY       Ordering Provider: Lynnae January, NP  Provider:  (Not yet assigned)  Question Answer Comment  Location [facility]   Reason for Consult? multiple psych comorbidities, extensive psych history, psych medication management      05/23/23 1223             Mode of Visit: In person    Psychiatry Consult Evaluation  Service Date: May 27, 2023 LOS:  LOS: 5 days  Chief Complaint "I am getting better sleep.  I start rehab tomorrow"  Primary Psychiatric Diagnoses  Major Depressive disorder, recurrent, severe without psychotic features  Assessment  Alexander Price is a 78 y.o. male admitted: Medicallyfor 05/22/2023  7:08 PM for possible stroke. He carries the psychiatric diagnoses of Major Depression and has a past medical history of hypertension, hyperlipidemia, hypothyroidism, Parkinsonism, and ongoing stroke work-up.  His initial presentation of depressed mood, lack of motivation, low energy level, poor concentration which has been ongoing for years and history of ECT is most consistent with severe, recurrent, major depressive disorder, vs treatment resistance depression. He does not meet criteria for inpatient admission based on absent suicidal or homicidal ideation, intent or plan and good support from family. Current outpatient psychotropic medications include Cymbalta 90 mg daily, Lithium Carbonate 450 mg at bedtime, Mirtazapine 15 mg at bedtime, Olanzapine 2.5 mg and Prazosin 5 mg at bedtime and historically he has had a moderate response to these medications. He was compliant with medications prior to admission as evidenced by reports from the daughter. On initial examination, patient is awake and alert, with psychomotor retardation  and poor eye contact. There is intermittent resting tremors, bradykinesia, and limb rigidity. His speech is low volume, mood is "depressed" with constricted affect. No SI/HI and no perceptual abnormalities.   05/25/2023: Patient seen face to face in his hospital room for follow up. He is alert but not fully awake. Patient reports he slept well overnight but remains anhedonic, weak, with low energy level. Patient denies hallucinations, delusions, or other perceptual abnormalities. He also denies suicidal or homicidal ideation, intent or plan. Patient presents with psychomotor retardation, poor eye contact with low volume speech. His mood is "depressed" with flat affect." Of note, Lithium Level done on 05/24/2023 came out to be 0.19. As a result, lithium carbonate will be started.   05/27/2023: Patient is seen face-to-face.  He is sleeping, but is arousable.  He states "I am getting better sleep".  He reports a good appetite.  He is excited that he will be starting rehab tomorrow to improve his ability to walk.  Patient does have some difficulty with date, but is oriented to person, place, situation and current events.  He specifically denies any suicidal ideation, plan, or intent.  He denies any homicidal ideation.  He denies auditory or visual hallucinations.  During his assessment, his son Leif Loflin, PA-C employed by [employer]) presents.  Patient is agreeable to medication review with his son.  Collateral from son states that patient was having parkinsonian like symptoms while on Zyprexa.  They are reassured that patient is no longer on this medication.  Reviewed that Ritalin was also not restarted.  Answered patient and son's questions to  their satisfaction. Reviewed patient will have a lithium level, TSH, T3, T4 and BMP to assess renal function on 05/28/2023.   Psychiatry will review labs tomorrow and make medication adjustments if indicated.   Diagnoses:  Active Hospital problems: Principal Problem:    Stroke determined by clinical assessment (HCC)    Plan   ## Psychiatric Medication Recommendations:  --Continue Cymbalta 90 daily for depression --Continue Prazosin 5 mg at bedtime for nightmares --Continue Mirtazapine 7.5 mg at bedtime for sleep. -- Continue Lithium Carbonate 450 mg at bedtime as augmentation therapy.  For 05/28/2018 --Discontinued Olanzapine due to risk of EPS.  -- Did not restart Ritalin  ## Medical Decision Making Capacity: Not specifically addressed in this encounter  ## Further Work-up:  -- Lithium level, was low; last TSH, on 03/10/2023 was elevated (6.36) T3 and T4 at that time were within normal limits -- EKG on 05/22/23 had QtC of 448, I am unable to review ECG from 05/26/2023 -- Pertinent labwork reviewed earlier this admission includes:HDL-35, LDL-136.    ## Disposition:-- There are no psychiatric contraindications to discharge at this time  ## Behavioral / Environmental: -Delirium Precautions: Delirium Interventions for Nursing and Staff: - RN to open blinds every AM. - To Bedside: Glasses, hearing aide, and pt's own shoes. Make available to patients. when possible and encourage use. - Encourage po fluids when appropriate, keep fluids within reach. - OOB to chair with meals. - Passive ROM exercises to all extremities with AM & PM care. - RN to assess orientation to person, time and place QAM and PRN. - Recommend extended visitation hours with familiar family/friends as feasible. - Staff to minimize disturbances at night. Turn off television when pt asleep or when not in use. or Patient would benefit from more frequent contact with medical team to delineate plan of care and allow for clarification questions, which will help alleviate anxiety regarding treatment. If possible, try to check back in with the pt in the afternoon.    ## Safety and Observation Level:  - Based on my clinical evaluation, I estimate the patient to be at low risk of self harm in the current  setting. - At this time, we recommend  routine. This decision is based on my review of the chart including patient's history and current presentation, interview of the patient, mental status examination, and consideration of suicide risk including evaluating suicidal ideation, plan, intent, suicidal or self-harm behaviors, risk factors, and protective factors. This judgment is based on our ability to directly address suicide risk, implement suicide prevention strategies, and develop a safety plan while the patient is in the clinical setting. Please contact our team if there is a concern that risk level has changed.  CSSR Risk Category:C-SSRS RISK CATEGORY: No Risk  Suicide Risk Assessment: Patient has following modifiable risk factors for suicide: under treated depression , which we are addressing by prescribing medications. Patient has following non-modifiable or demographic risk factors for suicide: male gender, history of suicide attempt, and psychiatric hospitalization Patient has the following protective factors against suicide: Supportive family  Thank you for this consult request. Recommendations have been communicated to the primary team.   Psychiatry will sign off.  Please re-consult for any future acute psychiatric concerns.  Lithium level, TSH, T3, T4 and BMP to assess renal function pending for 05/28/2023.   Psychiatry will review labs tomorrow and make medication adjustments if indicated.   While future psychiatric events cannot be accurately predicted, the patient does not currently require acute  inpatient psychiatric care and does not currently meet [facility] involuntary commitment criteria.   Mariel Craft, MD       History of Present Illness  Relevant Aspects of [facility] Course:  Admitted on 05/22/2023 for stroke-like symptoms. Daughter reports patient has had trouble with words and some neuro decline for around 4 months, says he's had possible old mini strokes.  Has been walking with walker with shuffling gait. Family was preparing to move him into ALF.   Patient Report:  Patient seen in his hospital room, joined by son at bedside.  Patient specifically has no SI, HI, AVH.  Son states father is much improved and back to his psychiatric baseline.  Patient is future oriented towards starting physical therapy rehabilitation tomorrow.   Collateral information:  Most of the initial information obtained from the patient's daughter at bedside.   Review of Systems  Psychiatric/Behavioral:  Positive for memory loss. Negative for depression, hallucinations, substance abuse and suicidal ideas. The patient is not nervous/anxious and does not have insomnia.      Psychiatric and Social History  Psychiatric History:  Information collected from daughter.   Prev Dx/Sx: Recurrent treatment resistance depression Current Psych Provider: Misty Stanley NP at [facility].  Home Meds (current): Cymbalta 90 mg daily, Lithium Carbonate 450 mg at bedtime, Mirtazapine 15 mg at bedtime, Olanzapine 2.5 mg and Prazosin 5 mg. Previous Med Trials:Unsure Therapy: denies   Prior Psych Hospitalization: Patient has had multiple hospitalizations in the past, but was last admitted in 2020. He has not been admitted since he had ECT treatment.   Prior Self Harm: Daughter reports 5 suicide attempts in the past before ECT treatment. She denies SI/SA in the last 3 years.  Prior Violence: denies   Family Psych History: Patient brother and sister have history of depression Family Hx suicide: denies   Social History:  Educational Hx: high school graduate Occupational Hx: retired.  Legal Hx: denies  Living Situation: Lives in an assisted living home  Spiritual Hx: unknown  Access to weapons/lethal means: denies    Substance History Alcohol: denies   Type of alcohol denies  Last Drink denies  Number of drinks per day denies  History of alcohol withdrawal seizures N/A History of DT's  N/A Tobacco: N/A Illicit drugs: Denies  Prescription drug abuse: denies  Rehab hx: denies   Exam Findings  Physical Exam:  Vital Signs:  Temp:  [97.8 F (36.6 C)-98.6 F (37 C)] 97.8 F (36.6 C) (03/04 0326) Pulse Rate:  [62-90] 62 (03/04 0326) Resp:  [16-18] 18 (03/04 0326) BP: (93-129)/(60-79) 115/79 (03/04 0326) SpO2:  [91 %-96 %] 95 % (03/04 0326) Blood pressure 115/79, pulse 62, temperature 97.8 F (36.6 C), resp. rate 18, height 6' (1.829 m), weight 115 kg, SpO2 95%. Body mass index is 34.38 kg/m.  Physical Exam  Mental Status Exam: General Appearance: Casual  Orientation:  Full (Time, Place, and Person) required some prompting for time  Memory:   Fair  Concentration:  Concentration: Good and Attention Span: Good  Recall:  Fair  Attention  Fair  Eye Contact:  Good  Speech:  Slow  Language:  Good  Volume:  Normal  Mood: "Much better"  Affect:  Congruent  Thought Process:  Goal Directed  Thought Content:  Logical  Suicidal Thoughts:  No  Homicidal Thoughts:  No  Judgement:  Fair  Insight:  Fair  Psychomotor Activity:  Psychomotor Retardation  Akathisia:  No  Fund of Knowledge:  Fair  Assets:  Communication Skills  Cognition:  Impaired,  Moderate  ADL's:  Impaired  AIMS (if indicated):        Other History   These have been pulled in through the EMR, reviewed, and updated if appropriate.  Family History:  The patient's Family history is unknown by patient.  Medical History: Past Medical History:  Diagnosis Date   Depression    sees Ellis Savage NP at [facility]    Hyperlipidemia     Surgical History: Past Surgical History:  Procedure Laterality Date   TONSILLECTOMY       Medications:   Current Facility-Administered Medications:    acetaminophen (TYLENOL) tablet 650 mg, 650 mg, Oral, Q4H PRN **OR** acetaminophen (TYLENOL) 160 MG/5ML solution 650 mg, 650 mg, Per Tube, Q4H PRN **OR** acetaminophen (TYLENOL) suppository 650 mg, 650  mg, Rectal, Q4H PRN, Erick Blinks, MD   aspirin EC tablet 81 mg, 81 mg, Oral, Daily, Richardo Priest, Erin C, NP, 81 mg at 05/27/23 0816   atorvastatin (LIPITOR) tablet 40 mg, 40 mg, Oral, Daily, Richardo Priest, Erin C, NP, 40 mg at 05/27/23 0816   Chlorhexidine Gluconate Cloth 2 % PADS 6 each, 6 each, Topical, Q0600, Erick Blinks, MD, 6 each at 05/27/23 0636   cholecalciferol (VITAMIN D3) 25 MCG (1000 UNIT) tablet 2,000 Units, 2,000 Units, Oral, q AM, Marvel Plan, MD, 2,000 Units at 05/27/23 0816   donepezil (ARICEPT) tablet 10 mg, 10 mg, Oral, QHS, Lehner, Erin C, NP, 10 mg at 05/26/23 2047   DULoxetine (CYMBALTA) DR capsule 90 mg, 90 mg, Oral, q AM, Marvel Plan, MD, 90 mg at 05/27/23 0815   enoxaparin (LOVENOX) injection 40 mg, 40 mg, Subcutaneous, Q24H, Marvel Plan, MD, 40 mg at 05/26/23 1549   levothyroxine (SYNTHROID) tablet 25 mcg, 25 mcg, Oral, Q0600, Hetty Blend C, NP, 25 mcg at 05/27/23 0636   lithium carbonate capsule 450 mg, 450 mg, Oral, QHS, Akintayo, Musa A, MD, 450 mg at 05/26/23 2046   mirtazapine (REMERON) tablet 7.5 mg, 7.5 mg, Oral, QHS, Marvel Plan, MD, 7.5 mg at 05/26/23 2047   Oral care mouth rinse, 15 mL, Mouth Rinse, PRN, Erick Blinks, MD   prazosin (MINIPRESS) capsule 5 mg, 5 mg, Oral, QHS, Marvel Plan, MD, 5 mg at 05/26/23 2046   senna-docusate (Senokot-S) tablet 1 tablet, 1 tablet, Oral, QHS PRN, Erick Blinks, MD  Allergies: No Known Allergies  Mariel Craft, MD  Total Time Spent in Direct Patient Care:  I personally spent 40 minutes on the unit in direct patient care. The direct patient care time included face-to-face time with the patient, reviewing the patient's chart, communicating with other professionals, and coordinating care. Greater than 50% of this time was spent in counseling or coordinating care with the patient regarding goals of hospitalization, psycho-education, and discharge planning needs.

## 2023-05-28 DIAGNOSIS — R258 Other abnormal involuntary movements: Secondary | ICD-10-CM | POA: Diagnosis not present

## 2023-05-28 DIAGNOSIS — F4321 Adjustment disorder with depressed mood: Secondary | ICD-10-CM | POA: Diagnosis not present

## 2023-05-28 DIAGNOSIS — I639 Cerebral infarction, unspecified: Secondary | ICD-10-CM | POA: Diagnosis not present

## 2023-05-28 DIAGNOSIS — E785 Hyperlipidemia, unspecified: Secondary | ICD-10-CM | POA: Diagnosis not present

## 2023-05-28 DIAGNOSIS — R29704 NIHSS score 4: Secondary | ICD-10-CM | POA: Diagnosis not present

## 2023-05-28 DIAGNOSIS — E039 Hypothyroidism, unspecified: Secondary | ICD-10-CM | POA: Diagnosis not present

## 2023-05-28 DIAGNOSIS — G459 Transient cerebral ischemic attack, unspecified: Secondary | ICD-10-CM | POA: Diagnosis not present

## 2023-05-28 DIAGNOSIS — R2681 Unsteadiness on feet: Secondary | ICD-10-CM | POA: Diagnosis not present

## 2023-05-28 DIAGNOSIS — R41841 Cognitive communication deficit: Secondary | ICD-10-CM | POA: Diagnosis not present

## 2023-05-28 DIAGNOSIS — F32A Depression, unspecified: Secondary | ICD-10-CM | POA: Diagnosis not present

## 2023-05-28 DIAGNOSIS — G2111 Neuroleptic induced parkinsonism: Secondary | ICD-10-CM | POA: Diagnosis not present

## 2023-05-28 DIAGNOSIS — R278 Other lack of coordination: Secondary | ICD-10-CM | POA: Diagnosis not present

## 2023-05-28 DIAGNOSIS — T43505A Adverse effect of unspecified antipsychotics and neuroleptics, initial encounter: Secondary | ICD-10-CM | POA: Diagnosis not present

## 2023-05-28 DIAGNOSIS — R262 Difficulty in walking, not elsewhere classified: Secondary | ICD-10-CM | POA: Diagnosis not present

## 2023-05-28 DIAGNOSIS — R531 Weakness: Secondary | ICD-10-CM | POA: Diagnosis not present

## 2023-05-28 DIAGNOSIS — G218 Other secondary parkinsonism: Secondary | ICD-10-CM | POA: Diagnosis not present

## 2023-05-28 DIAGNOSIS — R4701 Aphasia: Secondary | ICD-10-CM | POA: Diagnosis not present

## 2023-05-28 DIAGNOSIS — R338 Other retention of urine: Secondary | ICD-10-CM | POA: Diagnosis not present

## 2023-05-28 DIAGNOSIS — M6281 Muscle weakness (generalized): Secondary | ICD-10-CM | POA: Diagnosis not present

## 2023-05-28 DIAGNOSIS — Z7401 Bed confinement status: Secondary | ICD-10-CM | POA: Diagnosis not present

## 2023-05-28 DIAGNOSIS — I10 Essential (primary) hypertension: Secondary | ICD-10-CM | POA: Diagnosis not present

## 2023-05-28 LAB — CBC
HCT: 40.8 % (ref 39.0–52.0)
Hemoglobin: 13.6 g/dL (ref 13.0–17.0)
MCH: 32.2 pg (ref 26.0–34.0)
MCHC: 33.3 g/dL (ref 30.0–36.0)
MCV: 96.7 fL (ref 80.0–100.0)
Platelets: 246 10*3/uL (ref 150–400)
RBC: 4.22 MIL/uL (ref 4.22–5.81)
RDW: 12.3 % (ref 11.5–15.5)
WBC: 7 10*3/uL (ref 4.0–10.5)
nRBC: 0 % (ref 0.0–0.2)

## 2023-05-28 LAB — LITHIUM LEVEL: Lithium Lvl: 0.43 mmol/L — ABNORMAL LOW (ref 0.60–1.20)

## 2023-05-28 LAB — BASIC METABOLIC PANEL
Anion gap: 12 (ref 5–15)
BUN: 21 mg/dL (ref 8–23)
CO2: 23 mmol/L (ref 22–32)
Calcium: 9.3 mg/dL (ref 8.9–10.3)
Chloride: 105 mmol/L (ref 98–111)
Creatinine, Ser: 1.23 mg/dL (ref 0.61–1.24)
GFR, Estimated: >60 mL/min (ref >60)
Glucose, Bld: 98 mg/dL (ref 70–99)
Potassium: 4.1 mmol/L (ref 3.5–5.1)
Sodium: 140 mmol/L (ref 135–145)

## 2023-05-28 LAB — T4, FREE: Free T4: 0.99 ng/dL (ref 0.61–1.12)

## 2023-05-28 LAB — TSH: TSH: 11.157 u[IU]/mL — ABNORMAL HIGH (ref 0.350–4.500)

## 2023-05-28 MED ORDER — MIRTAZAPINE 7.5 MG PO TABS: 7.5000 mg | ORAL_TABLET | Freq: Every day | ORAL | 1 refills | Status: AC

## 2023-05-28 MED ORDER — ATORVASTATIN CALCIUM 40 MG PO TABS: 40.0000 mg | ORAL_TABLET | Freq: Every day | ORAL | 1 refills | Status: AC

## 2023-05-28 MED ORDER — BISACODYL 10 MG RE SUPP
10.0000 mg | Freq: Once | RECTAL | Status: AC
  Administered 2023-05-28: 10 mg via RECTAL
  Filled 2023-05-28: qty 1

## 2023-05-28 MED ORDER — LITHIUM CARBONATE 150 MG PO CAPS: 450.0000 mg | ORAL_CAPSULE | Freq: Every day | ORAL | 0 refills | Status: AC

## 2023-05-28 MED ORDER — ASPIRIN 81 MG PO TBEC: 81.0000 mg | DELAYED_RELEASE_TABLET | Freq: Every day | ORAL | 12 refills | Status: AC

## 2023-05-28 MED ORDER — DULOXETINE HCL 30 MG PO CPEP: 90.0000 mg | ORAL_CAPSULE | Freq: Every morning | ORAL | 1 refills | Status: AC

## 2023-05-28 NOTE — Plan of Care (Signed)

## 2023-05-28 NOTE — TOC Transition Note (Signed)
 Transition of Care [facility]) - Discharge Note   Patient Details  Name: Alexander Price MRN: [id number] Date of Birth: February 17, 1946  Transition of [facility]) CM/SW Contact:  Jazara Swiney Felipa Emory, Student-Social Work Phone Number: 05/28/2023, 3:18 PM   Clinical Narrative:   MSW Student received insurance approval for patient to admit to SNF [facility]. MSW Student confirmed with MD that patient is stable for discharge. MSW Student notified son Alexander Price and they agreeable with discharge. MSW Student confirmed bed available at SNF. Transport arranged with [facility] for next available.   Number to call report: [phone] RM: [phone] A    Final next level of care: Skilled Nursing Facility Barriers to Discharge: Barriers Resolved   Patient Goals and CMS Choice Patient states their goals for this hospitalization and ongoing recovery are:: To get better CMS Medicare.gov Compare Post Acute Care list provided to:: Patient Represenative (must comment) Choice offered to / list presented to : Adult Children [place] ownership interest in [facility].provided to:: Adult Children    Discharge Placement              Patient chooses bed at: [facility] Patient to be transferred to facility by: [facility] Name of family member notified: Alexander Price Patient and family notified of of transfer: 05/28/23  Discharge Plan and Services Additional resources added to the After Visit Summary for   In-house Referral: Clinical Social Work   Post Acute Care Choice: Skilled Nursing Facility                               Social Drivers of Health (SDOH) Interventions SDOH Screenings   Food Insecurity: No Food Insecurity (05/22/2023)  Housing: Low Risk  (05/22/2023)  Transportation Needs: No Transportation Needs (05/22/2023)  Utilities: Not At Risk (05/22/2023)  Depression (PHQ2-9): High Risk (03/10/2023)  Financial Resource Strain: Low Risk  (05/05/2018)   Received from [facility], [facility]  Physical  Activity: Insufficiently Active (05/05/2018)   Received from [facility], [facility]  Social Connections: Socially Isolated (05/22/2023)  Stress: No Stress Concern Present (05/05/2018)   [facility] from [facility], [facility]  Tobacco Use: Low Risk  (05/22/2023)  Health Literacy: Medium Risk (07/02/2020)   Received from [facility], [facility]     Readmission Risk Interventions     No data to display

## 2023-05-28 NOTE — Plan of Care (Signed)
  Problem: Education: Goal: Knowledge of disease or condition will improve Outcome: Progressing   Problem: Education: Goal: Knowledge of secondary prevention will improve (MUST DOCUMENT ALL) Outcome: Progressing   Problem: Education: Goal: Knowledge of patient specific risk factors will improve (DELETE if not current risk factor) Outcome: Progressing   Problem: Safety: Goal: Ability to remain free from injury will improve Outcome: Progressing

## 2023-05-28 NOTE — Consult Note (Signed)
 For [facility] Consult Follow-up  Patient Name: .Alexander Price  MRN: [id number]  DOB: 1945/05/11  Consult Order details:  Orders (From admission, onward)     Start     Ordered   05/23/23 1221  IP CONSULT TO PSYCHIATRY       Ordering Provider: Lynnae January, NP  Provider:  (Not yet assigned)  Question Answer Comment  Location [facility]   Reason for Consult? multiple psych comorbidities, extensive psych history, psych medication management      05/23/23 1223             Mode of Visit: In person    Psychiatry Consult Evaluation  Service Date: May 28, 2023 LOS:  LOS: 6 days  Chief Complaint "I feel good"  Primary Psychiatric Diagnoses  Major Depressive disorder, recurrent, severe without psychotic features  Assessment  Doug Bucklin is a 78 y.o. male admitted: Medicallyfor 05/22/2023  7:08 PM for possible stroke. He carries the psychiatric diagnoses of Major Depression and has a past medical history of hypertension, hyperlipidemia, hypothyroidism, Parkinsonism, and ongoing stroke work-up.  His initial presentation of depressed mood, lack of motivation, low energy level, poor concentration which has been ongoing for years and history of ECT is most consistent with severe, recurrent, major depressive disorder, vs treatment resistance depression. He does not meet criteria for inpatient admission based on absent suicidal or homicidal ideation, intent or plan and good support from family. Current outpatient psychotropic medications include Cymbalta 90 mg daily, Lithium Carbonate 450 mg at bedtime, Mirtazapine 15 mg at bedtime, Olanzapine 2.5 mg and Prazosin 5 mg at bedtime and historically he has had a moderate response to these medications. He was compliant with medications prior to admission as evidenced by reports from the daughter. On initial examination, patient is awake and alert, with psychomotor retardation and poor eye contact. There is  intermittent resting tremors, bradykinesia, and limb rigidity. His speech is low volume, mood is "depressed" with constricted affect. No SI/HI and no perceptual abnormalities.   05/25/2023: Patient seen face to face in his hospital room for follow up. He is alert but not fully awake. Patient reports he slept well overnight but remains anhedonic, weak, with low energy level. Patient denies hallucinations, delusions, or other perceptual abnormalities. He also denies suicidal or homicidal ideation, intent or plan. Patient presents with psychomotor retardation, poor eye contact with low volume speech. His mood is "depressed" with flat affect." Of note, Lithium Level done on 05/24/2023 came out to be 0.19. As a result, lithium carbonate will be started.   05/27/2023: Patient is seen face-to-face.  He is sleeping, but is arousable.  He states "I am getting better sleep".  He reports a good appetite.  He is excited that he will be starting rehab tomorrow to improve his ability to walk.  Patient does have some difficulty with date, but is oriented to person, place, situation and current events.  He specifically denies any suicidal ideation, plan, or intent.  He denies any homicidal ideation.  He denies auditory or visual hallucinations.  During his assessment, his son Cebastian Neis, PA-C employed by [employer]) presents.  Patient is agreeable to medication review with his son.  Collateral from son states that patient was having parkinsonian like symptoms while on Zyprexa.  They are reassured that patient is no longer on this medication.  Reviewed that Ritalin was also not restarted.  Answered patient and son's questions to their satisfaction. Reviewed patient will have  a lithium level, TSH, T3, T4 and BMP to assess renal function on 05/28/2023.   Psychiatry will review labs tomorrow and make medication adjustments if indicated.  05/28/2023: Patient is seen in his room.  His daughter, Angus Seller, RN is at his bedside.   Reviewed medications and labs with patient's daughter.  Of note, patient had further elevation of his TSH with slight elevation of his lithium levels.  T4 was normal and T3 is pending.  Reviewed effects of lithium on thyroid function and no need for medication adjustment at this time, however patient should continue to have the levels monitored by either his outpatient psychiatrist, Ellis Savage at [facility], or his primary care provider.  Patient is excited about starting physical therapy at skilled nursing facility.  Daughter expressed concern for his shuffling gait which will be addressed at physical therapy.  I am not certain that this will improve now that Zyprexa has been discontinued, as some changing gait is expected following his stroke.  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  Daughter states these are longstanding.  He does have some intermittent fine tremor of his upper extremities.  This could certainly be related to lithium and should continue to be monitored.  Patient describes his mood is good.  He states that he is sleeping well.  He has a good appetite.  He does voice concerns for constipation and is not certain that he has had a bowel movement in 2-3 days.  Daughter will follow up and continue to help track bowel movements after discharge.  He is wearing a soft wick catheter which he forgot he was wearing.  Otherwise his overall orientation has improved significantly.  He denies any suicidal ideation, plan or intent.  He denies any homicidal ideation.  He denies any AVH and does not appear to be responding to internal stimuli or showing obvious signs of paranoia.   Diagnoses:  Active Hospital problems: Principal Problem:   Stroke determined by clinical assessment (HCC)    Plan   ## Psychiatric Medication Recommendations:  --Continue Cymbalta 90 daily for  depression --Continue Prazosin 5 mg at bedtime for nightmares --Continue Mirtazapine 7.5 mg at bedtime for sleep. -- Continue Lithium Carbonate 450 mg at bedtime as augmentation therapy.  For 05/28/2018 --Discontinued Olanzapine due to risk of EPS.  -- Did not restart Ritalin  ## Medical Decision Making Capacity: Not specifically addressed in this encounter  ## Further Work-up:  -- Lithium level, was low; last TSH, on 03/10/2023 was elevated (6.36) T3 and T4 at that time were within normal limits -- EKG on 05/22/23 had QtC of 448 (Filed on 05/26/2023) -- Pertinent labwork reviewed earlier this admission includes:HDL-35, LDL-136.    ## Disposition:-- There are no psychiatric contraindications to discharge at this time  ## Behavioral / Environmental: -Delirium Precautions: Delirium Interventions for Nursing and Staff: - RN to open blinds every AM. - To Bedside: Glasses, hearing aide, and pt's own shoes. Make available to patients. when possible and encourage use. - Encourage po fluids when appropriate, keep fluids within reach. - OOB to chair with meals. - Passive ROM exercises to all extremities with AM & PM care. - RN to assess orientation to person, time and place QAM and PRN. - Recommend extended visitation hours with familiar family/friends as feasible. - Staff to minimize disturbances at night. Turn off television when pt asleep or when not in use.  or Patient would benefit from more frequent contact with medical team to delineate plan of care and allow for clarification questions, which will help alleviate anxiety regarding treatment. If possible, try to check back in with the pt in the afternoon.    ## Safety and Observation Level:  - Based on my clinical evaluation, I estimate the patient to be at low risk of self harm in the current setting. - At this time, we recommend  routine. This decision is based on my review of the chart including patient's history and current presentation, interview  of the patient, mental status examination, and consideration of suicide risk including evaluating suicidal ideation, plan, intent, suicidal or self-harm behaviors, risk factors, and protective factors. This judgment is based on our ability to directly address suicide risk, implement suicide prevention strategies, and develop a safety plan while the patient is in the clinical setting. Please contact our team if there is a concern that risk level has changed.  CSSR Risk Category:C-SSRS RISK CATEGORY: No Risk  Suicide Risk Assessment: Patient has following modifiable risk factors for suicide: under treated depression , which we are addressing by prescribing medications. Patient has following non-modifiable or demographic risk factors for suicide: male gender, history of suicide attempt, and psychiatric hospitalization Patient has the following protective factors against suicide: Supportive family  Thank you for this consult request. Recommendations have been communicated to the primary team.   Psychiatry will sign off.  Please re-consult for any future acute psychiatric concerns.   While future psychiatric events cannot be accurately predicted, the patient does not currently require acute inpatient psychiatric care and does not currently meet [facility] involuntary commitment criteria.   Mariel Craft, MD       History of Present Illness  Relevant Aspects of [facility] Course:  Admitted on 05/22/2023 for stroke-like symptoms. Daughter reports patient has had trouble with words and some neuro decline for around 4 months, says he's had possible old mini strokes. Has been walking with walker with shuffling gait. Family was preparing to move him into ALF.   Patient Report:  Patient seen in his hospital room, joined by son at bedside.  Patient specifically has no SI, HI, AVH.  Son states father is much improved and back to his psychiatric baseline.  Patient is future oriented towards  starting physical therapy rehabilitation tomorrow.   Collateral information:  Most of the initial information obtained from the patient's daughter at bedside.   Review of Systems  Psychiatric/Behavioral:  Positive for memory loss. Negative for depression, hallucinations, substance abuse and suicidal ideas. The patient is not nervous/anxious and does not have insomnia.      Psychiatric and Social History  Psychiatric History:  Information collected from daughter.   Prev Dx/Sx: Recurrent treatment resistance depression Current Psych Provider: Misty Stanley NP at [facility].  Home Meds (current): Cymbalta 90 mg daily, Lithium Carbonate 450 mg at bedtime, Mirtazapine 15 mg at bedtime, Olanzapine 2.5 mg and Prazosin 5 mg. Previous Med Trials:Unsure Therapy: denies   Prior Psych Hospitalization: Patient has had multiple hospitalizations in the past, but was last admitted in 2020. He has not been admitted since he had ECT treatment.   Prior Self Harm: Daughter reports 5 suicide attempts in the past before ECT treatment. She denies SI/SA in the last 3 years.  Prior Violence: denies   Family Psych History: Patient brother and sister have history of depression Family Hx suicide: denies   Social History:  Educational Hx: high school  graduate Occupational Hx: retired.  Legal Hx: denies  Living Situation: Lives in an assisted living home  Spiritual Hx: unknown  Access to weapons/lethal means: denies    Substance History Alcohol: denies   Type of alcohol denies  Last Drink denies  Number of drinks per day denies  History of alcohol withdrawal seizures N/A History of DT's N/A Tobacco: N/A Illicit drugs: Denies  Prescription drug abuse: denies  Rehab hx: denies   Exam Findings  Physical Exam:  Vital Signs:  Temp:  [97.6 F (36.4 C)-98.3 F (36.8 C)] 97.7 F (36.5 C) (03/05 1119) Pulse Rate:  [68-80] 71 (03/05 1119) Resp:  [16-18] 16 (03/05 0751) BP: (109-141)/(69-83) 130/76  (03/05 1119) SpO2:  [92 %-96 %] 94 % (03/05 1119) Blood pressure 130/76, pulse 71, temperature 97.7 F (36.5 C), temperature source Oral, resp. rate 16, height 6' (1.829 m), weight 115 kg, SpO2 94%. Body mass index is 34.38 kg/m.  Physical Exam  Mental Status Exam: General Appearance: Casual  Orientation:  Full (Time, Place, and Person)   Memory:   Fair  Concentration:  Concentration: Good and Attention Span: Good  Recall:  Fair  Attention  Fair  Eye Contact:  Good  Speech:  Slow  Language:  Good  Volume:  Normal  Mood: "Good"  Affect:  Congruent  Thought Process:  Goal Directed  Thought Content:  Logical  Suicidal Thoughts:  No  Homicidal Thoughts:  No  Judgement:  Fair  Insight:  Fair  Psychomotor Activity:  Psychomotor Retardation  Akathisia:  No  Fund of Knowledge:  Fair      Assets:  Communication Skills  Cognition:  Impaired,  Moderate  ADL's:  Impaired  AIMS (if indicated):        Other History   These have been pulled in through the EMR, reviewed, and updated if appropriate.  Family History:  The patient's Family history is unknown by patient.  Medical History: Past Medical History:  Diagnosis Date   Depression    sees Ellis Savage NP at [facility]    Hyperlipidemia     Surgical History: Past Surgical History:  Procedure Laterality Date   TONSILLECTOMY       Medications:   Current Facility-Administered Medications:    acetaminophen (TYLENOL) tablet 650 mg, 650 mg, Oral, Q4H PRN **OR** acetaminophen (TYLENOL) 160 MG/5ML solution 650 mg, 650 mg, Per Tube, Q4H PRN **OR** acetaminophen (TYLENOL) suppository 650 mg, 650 mg, Rectal, Q4H PRN, Erick Blinks, MD   aspirin EC tablet 81 mg, 81 mg, Oral, Daily, Richardo Priest, Erin C, NP, 81 mg at 05/28/23 1125   atorvastatin (LIPITOR) tablet 40 mg, 40 mg, Oral, Daily, Richardo Priest, Erin C, NP, 40 mg at 05/28/23 1126   Chlorhexidine Gluconate Cloth 2 % PADS 6 each, 6 each, Topical, Q0600, Erick Blinks,  MD, 6 each at 05/28/23 0416   cholecalciferol (VITAMIN D3) 25 MCG (1000 UNIT) tablet 2,000 Units, 2,000 Units, Oral, q AM, Marvel Plan, MD, 2,000 Units at 05/28/23 1125   donepezil (ARICEPT) tablet 10 mg, 10 mg, Oral, QHS, Lehner, Erin C, NP, 10 mg at 05/27/23 2203   DULoxetine (CYMBALTA) DR capsule 90 mg, 90 mg, Oral, q AM, Marvel Plan, MD, 90 mg at 05/28/23 1125   enoxaparin (LOVENOX) injection 40 mg, 40 mg, Subcutaneous, Q24H, Marvel Plan, MD, 40 mg at 05/27/23 1703   levothyroxine (SYNTHROID) tablet 25 mcg, 25 mcg, Oral, Q0600, Hetty Blend C, NP, 25 mcg at 05/28/23 0531   lithium carbonate capsule 450 mg,  450 mg, Oral, QHS, Akintayo, Musa A, MD, 450 mg at 05/27/23 2203   mirtazapine (REMERON) tablet 7.5 mg, 7.5 mg, Oral, QHS, Marvel Plan, MD, 7.5 mg at 05/27/23 2202   Oral care mouth rinse, 15 mL, Mouth Rinse, PRN, Erick Blinks, MD   prazosin (MINIPRESS) capsule 5 mg, 5 mg, Oral, QHS, Marvel Plan, MD, 5 mg at 05/27/23 2202   senna-docusate (Senokot-S) tablet 1 tablet, 1 tablet, Oral, QHS PRN, Erick Blinks, MD  Allergies: No Known Allergies  Mariel Craft, MD  Total Time Spent in Direct Patient Care:  I personally spent 40 minutes on the unit in direct patient care. The direct patient care time included face-to-face time with the patient, reviewing the patient's chart, communicating with other professionals, and coordinating care. Greater than 50% of this time was spent in counseling or coordinating care with the patient regarding goals of hospitalization, psycho-education, and discharge planning needs.

## 2023-05-29 DIAGNOSIS — I10 Essential (primary) hypertension: Secondary | ICD-10-CM | POA: Diagnosis not present

## 2023-05-29 DIAGNOSIS — R258 Other abnormal involuntary movements: Secondary | ICD-10-CM | POA: Diagnosis not present

## 2023-05-29 DIAGNOSIS — F32A Depression, unspecified: Secondary | ICD-10-CM | POA: Diagnosis not present

## 2023-05-29 DIAGNOSIS — E785 Hyperlipidemia, unspecified: Secondary | ICD-10-CM | POA: Diagnosis not present

## 2023-05-29 LAB — T3, FREE: T3, Free: 2.9 pg/mL (ref 2.0–4.4)

## 2023-06-02 DIAGNOSIS — Z0279 Encounter for issue of other medical certificate: Secondary | ICD-10-CM

## 2023-06-02 NOTE — Telephone Encounter (Signed)
 Left message on machine for son that the form is complete and ready to be picked up.

## 2023-06-09 DIAGNOSIS — M6281 Muscle weakness (generalized): Secondary | ICD-10-CM | POA: Diagnosis not present

## 2023-06-09 DIAGNOSIS — I639 Cerebral infarction, unspecified: Secondary | ICD-10-CM | POA: Diagnosis not present

## 2023-06-09 DIAGNOSIS — R338 Other retention of urine: Secondary | ICD-10-CM | POA: Diagnosis not present

## 2023-06-09 DIAGNOSIS — R262 Difficulty in walking, not elsewhere classified: Secondary | ICD-10-CM | POA: Diagnosis not present

## 2023-06-09 DIAGNOSIS — G218 Other secondary parkinsonism: Secondary | ICD-10-CM | POA: Diagnosis not present

## 2023-06-19 ENCOUNTER — Telehealth: Payer: Self-pay

## 2023-06-19 DIAGNOSIS — E559 Vitamin D deficiency, unspecified: Secondary | ICD-10-CM | POA: Diagnosis not present

## 2023-06-19 DIAGNOSIS — F0150 Vascular dementia without behavioral disturbance: Secondary | ICD-10-CM | POA: Diagnosis not present

## 2023-06-19 DIAGNOSIS — I69311 Memory deficit following cerebral infarction: Secondary | ICD-10-CM | POA: Diagnosis not present

## 2023-06-19 DIAGNOSIS — E039 Hypothyroidism, unspecified: Secondary | ICD-10-CM | POA: Diagnosis not present

## 2023-06-19 DIAGNOSIS — F0280 Dementia in other diseases classified elsewhere without behavioral disturbance: Secondary | ICD-10-CM | POA: Diagnosis not present

## 2023-06-19 DIAGNOSIS — F5101 Primary insomnia: Secondary | ICD-10-CM | POA: Diagnosis not present

## 2023-06-19 DIAGNOSIS — K5909 Other constipation: Secondary | ICD-10-CM | POA: Diagnosis not present

## 2023-06-19 DIAGNOSIS — E7849 Other hyperlipidemia: Secondary | ICD-10-CM | POA: Diagnosis not present

## 2023-06-19 DIAGNOSIS — R630 Anorexia: Secondary | ICD-10-CM | POA: Diagnosis not present

## 2023-06-19 NOTE — Transitions of Care (Post Inpatient/ED Visit) (Signed)
   06/19/2023  Name: Alexander Price MRN: [id number] DOB: 26-Oct-1945  Today's TOC FU Call Status: Today's TOC FU Call Status:: Unsuccessful Call (1st Attempt) Unsuccessful Call (1st Attempt) Date: 06/19/23  Attempted to reach the patient regarding the most recent Inpatient/ED visit.  Follow Up Plan: Additional outreach attempts will be made to reach the patient to complete the Transitions of Care (Post Inpatient/ED visit) call.   Signature Karena Addison, LPN [facility] Nurse Health Advisor Direct Dial [phone]

## 2023-06-20 NOTE — Transitions of Care (Post Inpatient/ED Visit) (Signed)
   06/20/2023  Name: Alexander Price MRN: [id number] DOB: 1946-02-04  Today's TOC FU Call Status: Today's TOC FU Call Status:: Unsuccessful Call (2nd Attempt) Unsuccessful Call (1st Attempt) Date: 06/19/23 Unsuccessful Call (2nd Attempt) Date: 06/20/23  Attempted to reach the patient regarding the most recent Inpatient/ED visit.  Follow Up Plan: Additional outreach attempts will be made to reach the patient to complete the Transitions of Care (Post Inpatient/ED visit) call.   Signature Karena Addison, LPN [facility] Nurse Health Advisor Direct Dial [phone]

## 2023-06-23 DIAGNOSIS — R630 Anorexia: Secondary | ICD-10-CM | POA: Diagnosis not present

## 2023-06-23 DIAGNOSIS — F0150 Vascular dementia without behavioral disturbance: Secondary | ICD-10-CM | POA: Diagnosis not present

## 2023-06-24 DIAGNOSIS — E785 Hyperlipidemia, unspecified: Secondary | ICD-10-CM | POA: Diagnosis not present

## 2023-06-24 DIAGNOSIS — Z1329 Encounter for screening for other suspected endocrine disorder: Secondary | ICD-10-CM | POA: Diagnosis not present

## 2023-06-24 DIAGNOSIS — I10 Essential (primary) hypertension: Secondary | ICD-10-CM | POA: Diagnosis not present

## 2023-06-24 DIAGNOSIS — Z131 Encounter for screening for diabetes mellitus: Secondary | ICD-10-CM | POA: Diagnosis not present

## 2023-06-24 NOTE — Transitions of Care (Post Inpatient/ED Visit) (Signed)
   06/24/2023  Name: Scotty Weigelt MRN: [id number] DOB: 1945/12/21  Today's TOC FU Call Status: Today's TOC FU Call Status:: Unsuccessful Call (3rd Attempt) Unsuccessful Call (1st Attempt) Date: 06/19/23 Unsuccessful Call (2nd Attempt) Date: 06/20/23 Unsuccessful Call (3rd Attempt) Date: 06/24/23  Attempted to reach the patient regarding the most recent Inpatient/ED visit.  Follow Up Plan: No further outreach attempts will be made at this time. We have been unable to contact the patient.  Signature Karena Addison, LPN [facility] Nurse Health Advisor Direct Dial [phone]

## 2023-06-26 DIAGNOSIS — F0150 Vascular dementia without behavioral disturbance: Secondary | ICD-10-CM | POA: Diagnosis not present

## 2023-06-26 DIAGNOSIS — M6259 Muscle wasting and atrophy, not elsewhere classified, multiple sites: Secondary | ICD-10-CM | POA: Diagnosis not present

## 2023-06-26 DIAGNOSIS — R2689 Other abnormalities of gait and mobility: Secondary | ICD-10-CM | POA: Diagnosis not present

## 2023-06-26 DIAGNOSIS — R2681 Unsteadiness on feet: Secondary | ICD-10-CM | POA: Diagnosis not present

## 2023-06-26 DIAGNOSIS — E039 Hypothyroidism, unspecified: Secondary | ICD-10-CM | POA: Diagnosis not present

## 2023-06-26 DIAGNOSIS — R278 Other lack of coordination: Secondary | ICD-10-CM | POA: Diagnosis not present

## 2023-06-26 DIAGNOSIS — F0280 Dementia in other diseases classified elsewhere without behavioral disturbance: Secondary | ICD-10-CM | POA: Diagnosis not present

## 2023-06-26 DIAGNOSIS — F5101 Primary insomnia: Secondary | ICD-10-CM | POA: Diagnosis not present

## 2023-06-26 DIAGNOSIS — I69311 Memory deficit following cerebral infarction: Secondary | ICD-10-CM | POA: Diagnosis not present

## 2023-06-30 DIAGNOSIS — F331 Major depressive disorder, recurrent, moderate: Secondary | ICD-10-CM | POA: Diagnosis not present

## 2023-06-30 DIAGNOSIS — Z8673 Personal history of transient ischemic attack (TIA), and cerebral infarction without residual deficits: Secondary | ICD-10-CM | POA: Diagnosis not present

## 2023-06-30 DIAGNOSIS — K5901 Slow transit constipation: Secondary | ICD-10-CM | POA: Diagnosis not present

## 2023-06-30 DIAGNOSIS — Z7982 Long term (current) use of aspirin: Secondary | ICD-10-CM | POA: Diagnosis not present

## 2023-06-30 DIAGNOSIS — E038 Other specified hypothyroidism: Secondary | ICD-10-CM | POA: Diagnosis not present

## 2023-06-30 DIAGNOSIS — N401 Enlarged prostate with lower urinary tract symptoms: Secondary | ICD-10-CM | POA: Diagnosis not present

## 2023-06-30 DIAGNOSIS — G2119 Other drug induced secondary parkinsonism: Secondary | ICD-10-CM | POA: Diagnosis not present

## 2023-06-30 DIAGNOSIS — I69320 Aphasia following cerebral infarction: Secondary | ICD-10-CM | POA: Diagnosis not present

## 2023-06-30 DIAGNOSIS — E8881 Metabolic syndrome: Secondary | ICD-10-CM | POA: Diagnosis not present

## 2023-07-01 DIAGNOSIS — M6259 Muscle wasting and atrophy, not elsewhere classified, multiple sites: Secondary | ICD-10-CM | POA: Diagnosis not present

## 2023-07-01 DIAGNOSIS — R2681 Unsteadiness on feet: Secondary | ICD-10-CM | POA: Diagnosis not present

## 2023-07-01 DIAGNOSIS — R2689 Other abnormalities of gait and mobility: Secondary | ICD-10-CM | POA: Diagnosis not present

## 2023-07-01 DIAGNOSIS — R278 Other lack of coordination: Secondary | ICD-10-CM | POA: Diagnosis not present

## 2023-07-03 DIAGNOSIS — R2681 Unsteadiness on feet: Secondary | ICD-10-CM | POA: Diagnosis not present

## 2023-07-03 DIAGNOSIS — R278 Other lack of coordination: Secondary | ICD-10-CM | POA: Diagnosis not present

## 2023-07-03 DIAGNOSIS — R2689 Other abnormalities of gait and mobility: Secondary | ICD-10-CM | POA: Diagnosis not present

## 2023-07-03 DIAGNOSIS — M6259 Muscle wasting and atrophy, not elsewhere classified, multiple sites: Secondary | ICD-10-CM | POA: Diagnosis not present

## 2023-07-08 DIAGNOSIS — R2681 Unsteadiness on feet: Secondary | ICD-10-CM | POA: Diagnosis not present

## 2023-07-08 DIAGNOSIS — R2689 Other abnormalities of gait and mobility: Secondary | ICD-10-CM | POA: Diagnosis not present

## 2023-07-08 DIAGNOSIS — R278 Other lack of coordination: Secondary | ICD-10-CM | POA: Diagnosis not present

## 2023-07-08 DIAGNOSIS — M6259 Muscle wasting and atrophy, not elsewhere classified, multiple sites: Secondary | ICD-10-CM | POA: Diagnosis not present

## 2023-07-09 DIAGNOSIS — R278 Other lack of coordination: Secondary | ICD-10-CM | POA: Diagnosis not present

## 2023-07-09 DIAGNOSIS — M6259 Muscle wasting and atrophy, not elsewhere classified, multiple sites: Secondary | ICD-10-CM | POA: Diagnosis not present

## 2023-07-09 DIAGNOSIS — R2681 Unsteadiness on feet: Secondary | ICD-10-CM | POA: Diagnosis not present

## 2023-07-09 DIAGNOSIS — R2689 Other abnormalities of gait and mobility: Secondary | ICD-10-CM | POA: Diagnosis not present

## 2023-07-10 DIAGNOSIS — F0150 Vascular dementia without behavioral disturbance: Secondary | ICD-10-CM | POA: Diagnosis not present

## 2023-07-10 DIAGNOSIS — E039 Hypothyroidism, unspecified: Secondary | ICD-10-CM | POA: Diagnosis not present

## 2023-07-10 DIAGNOSIS — F0280 Dementia in other diseases classified elsewhere without behavioral disturbance: Secondary | ICD-10-CM | POA: Diagnosis not present

## 2023-07-10 DIAGNOSIS — F5104 Psychophysiologic insomnia: Secondary | ICD-10-CM | POA: Diagnosis not present

## 2023-07-15 DIAGNOSIS — F0280 Dementia in other diseases classified elsewhere without behavioral disturbance: Secondary | ICD-10-CM | POA: Diagnosis not present

## 2023-07-15 DIAGNOSIS — R278 Other lack of coordination: Secondary | ICD-10-CM | POA: Diagnosis not present

## 2023-07-15 DIAGNOSIS — R2689 Other abnormalities of gait and mobility: Secondary | ICD-10-CM | POA: Diagnosis not present

## 2023-07-15 DIAGNOSIS — E7849 Other hyperlipidemia: Secondary | ICD-10-CM | POA: Diagnosis not present

## 2023-07-15 DIAGNOSIS — E039 Hypothyroidism, unspecified: Secondary | ICD-10-CM | POA: Diagnosis not present

## 2023-07-15 DIAGNOSIS — Z8673 Personal history of transient ischemic attack (TIA), and cerebral infarction without residual deficits: Secondary | ICD-10-CM | POA: Diagnosis not present

## 2023-07-15 DIAGNOSIS — F0150 Vascular dementia without behavioral disturbance: Secondary | ICD-10-CM | POA: Diagnosis not present

## 2023-07-15 DIAGNOSIS — M6259 Muscle wasting and atrophy, not elsewhere classified, multiple sites: Secondary | ICD-10-CM | POA: Diagnosis not present

## 2023-07-15 DIAGNOSIS — R2681 Unsteadiness on feet: Secondary | ICD-10-CM | POA: Diagnosis not present

## 2023-07-17 DIAGNOSIS — E038 Other specified hypothyroidism: Secondary | ICD-10-CM | POA: Diagnosis not present

## 2023-07-17 DIAGNOSIS — G2119 Other drug induced secondary parkinsonism: Secondary | ICD-10-CM | POA: Diagnosis not present

## 2023-07-18 DIAGNOSIS — R2681 Unsteadiness on feet: Secondary | ICD-10-CM | POA: Diagnosis not present

## 2023-07-18 DIAGNOSIS — M6259 Muscle wasting and atrophy, not elsewhere classified, multiple sites: Secondary | ICD-10-CM | POA: Diagnosis not present

## 2023-07-18 DIAGNOSIS — R2689 Other abnormalities of gait and mobility: Secondary | ICD-10-CM | POA: Diagnosis not present

## 2023-07-18 DIAGNOSIS — R278 Other lack of coordination: Secondary | ICD-10-CM | POA: Diagnosis not present

## 2023-07-21 DIAGNOSIS — M6259 Muscle wasting and atrophy, not elsewhere classified, multiple sites: Secondary | ICD-10-CM | POA: Diagnosis not present

## 2023-07-21 DIAGNOSIS — R278 Other lack of coordination: Secondary | ICD-10-CM | POA: Diagnosis not present

## 2023-07-21 DIAGNOSIS — R2689 Other abnormalities of gait and mobility: Secondary | ICD-10-CM | POA: Diagnosis not present

## 2023-07-21 DIAGNOSIS — R2681 Unsteadiness on feet: Secondary | ICD-10-CM | POA: Diagnosis not present

## 2023-07-23 DIAGNOSIS — M6259 Muscle wasting and atrophy, not elsewhere classified, multiple sites: Secondary | ICD-10-CM | POA: Diagnosis not present

## 2023-07-23 DIAGNOSIS — R278 Other lack of coordination: Secondary | ICD-10-CM | POA: Diagnosis not present

## 2023-07-23 DIAGNOSIS — R2681 Unsteadiness on feet: Secondary | ICD-10-CM | POA: Diagnosis not present

## 2023-07-23 DIAGNOSIS — R2689 Other abnormalities of gait and mobility: Secondary | ICD-10-CM | POA: Diagnosis not present

## 2023-07-29 DIAGNOSIS — M6259 Muscle wasting and atrophy, not elsewhere classified, multiple sites: Secondary | ICD-10-CM | POA: Diagnosis not present

## 2023-07-29 DIAGNOSIS — R2681 Unsteadiness on feet: Secondary | ICD-10-CM | POA: Diagnosis not present

## 2023-07-29 DIAGNOSIS — R278 Other lack of coordination: Secondary | ICD-10-CM | POA: Diagnosis not present

## 2023-07-29 DIAGNOSIS — R2689 Other abnormalities of gait and mobility: Secondary | ICD-10-CM | POA: Diagnosis not present

## 2023-08-01 DIAGNOSIS — R2689 Other abnormalities of gait and mobility: Secondary | ICD-10-CM | POA: Diagnosis not present

## 2023-08-01 DIAGNOSIS — R278 Other lack of coordination: Secondary | ICD-10-CM | POA: Diagnosis not present

## 2023-08-01 DIAGNOSIS — R2681 Unsteadiness on feet: Secondary | ICD-10-CM | POA: Diagnosis not present

## 2023-08-01 DIAGNOSIS — M6259 Muscle wasting and atrophy, not elsewhere classified, multiple sites: Secondary | ICD-10-CM | POA: Diagnosis not present

## 2023-08-05 DIAGNOSIS — R2681 Unsteadiness on feet: Secondary | ICD-10-CM | POA: Diagnosis not present

## 2023-08-05 DIAGNOSIS — F331 Major depressive disorder, recurrent, moderate: Secondary | ICD-10-CM | POA: Diagnosis not present

## 2023-08-05 DIAGNOSIS — R278 Other lack of coordination: Secondary | ICD-10-CM | POA: Diagnosis not present

## 2023-08-05 DIAGNOSIS — M6259 Muscle wasting and atrophy, not elsewhere classified, multiple sites: Secondary | ICD-10-CM | POA: Diagnosis not present

## 2023-08-05 DIAGNOSIS — Z8673 Personal history of transient ischemic attack (TIA), and cerebral infarction without residual deficits: Secondary | ICD-10-CM | POA: Diagnosis not present

## 2023-08-05 DIAGNOSIS — E039 Hypothyroidism, unspecified: Secondary | ICD-10-CM | POA: Diagnosis not present

## 2023-08-05 DIAGNOSIS — F0150 Vascular dementia without behavioral disturbance: Secondary | ICD-10-CM | POA: Diagnosis not present

## 2023-08-05 DIAGNOSIS — F0280 Dementia in other diseases classified elsewhere without behavioral disturbance: Secondary | ICD-10-CM | POA: Diagnosis not present

## 2023-08-05 DIAGNOSIS — R2689 Other abnormalities of gait and mobility: Secondary | ICD-10-CM | POA: Diagnosis not present

## 2023-08-07 DIAGNOSIS — R2681 Unsteadiness on feet: Secondary | ICD-10-CM | POA: Diagnosis not present

## 2023-08-07 DIAGNOSIS — R2689 Other abnormalities of gait and mobility: Secondary | ICD-10-CM | POA: Diagnosis not present

## 2023-08-07 DIAGNOSIS — M6259 Muscle wasting and atrophy, not elsewhere classified, multiple sites: Secondary | ICD-10-CM | POA: Diagnosis not present

## 2023-08-07 DIAGNOSIS — R278 Other lack of coordination: Secondary | ICD-10-CM | POA: Diagnosis not present

## 2023-08-08 DIAGNOSIS — F332 Major depressive disorder, recurrent severe without psychotic features: Secondary | ICD-10-CM | POA: Diagnosis not present

## 2023-08-08 DIAGNOSIS — F03A4 Unspecified dementia, mild, with anxiety: Secondary | ICD-10-CM | POA: Diagnosis not present

## 2023-08-08 DIAGNOSIS — F419 Anxiety disorder, unspecified: Secondary | ICD-10-CM | POA: Diagnosis not present

## 2023-08-08 DIAGNOSIS — F03A3 Unspecified dementia, mild, with mood disturbance: Secondary | ICD-10-CM | POA: Diagnosis not present

## 2023-08-08 DIAGNOSIS — F5104 Psychophysiologic insomnia: Secondary | ICD-10-CM | POA: Diagnosis not present

## 2023-08-08 DIAGNOSIS — F03A18 Unspecified dementia, mild, with other behavioral disturbance: Secondary | ICD-10-CM | POA: Diagnosis not present

## 2023-08-11 DIAGNOSIS — R278 Other lack of coordination: Secondary | ICD-10-CM | POA: Diagnosis not present

## 2023-08-11 DIAGNOSIS — M6259 Muscle wasting and atrophy, not elsewhere classified, multiple sites: Secondary | ICD-10-CM | POA: Diagnosis not present

## 2023-08-11 DIAGNOSIS — R2681 Unsteadiness on feet: Secondary | ICD-10-CM | POA: Diagnosis not present

## 2023-08-11 DIAGNOSIS — R2689 Other abnormalities of gait and mobility: Secondary | ICD-10-CM | POA: Diagnosis not present

## 2023-08-11 DIAGNOSIS — Z79899 Other long term (current) drug therapy: Secondary | ICD-10-CM | POA: Diagnosis not present

## 2023-08-11 DIAGNOSIS — F329 Major depressive disorder, single episode, unspecified: Secondary | ICD-10-CM | POA: Diagnosis not present

## 2023-08-12 DIAGNOSIS — Z8673 Personal history of transient ischemic attack (TIA), and cerebral infarction without residual deficits: Secondary | ICD-10-CM | POA: Diagnosis not present

## 2023-08-12 DIAGNOSIS — K5901 Slow transit constipation: Secondary | ICD-10-CM | POA: Diagnosis not present

## 2023-08-12 DIAGNOSIS — E039 Hypothyroidism, unspecified: Secondary | ICD-10-CM | POA: Diagnosis not present

## 2023-08-12 DIAGNOSIS — F0150 Vascular dementia without behavioral disturbance: Secondary | ICD-10-CM | POA: Diagnosis not present

## 2023-08-12 DIAGNOSIS — F0280 Dementia in other diseases classified elsewhere without behavioral disturbance: Secondary | ICD-10-CM | POA: Diagnosis not present

## 2023-08-13 DIAGNOSIS — G219 Secondary parkinsonism, unspecified: Secondary | ICD-10-CM | POA: Diagnosis not present

## 2023-08-13 DIAGNOSIS — E039 Hypothyroidism, unspecified: Secondary | ICD-10-CM | POA: Diagnosis not present

## 2023-08-14 DIAGNOSIS — R278 Other lack of coordination: Secondary | ICD-10-CM | POA: Diagnosis not present

## 2023-08-14 DIAGNOSIS — M6259 Muscle wasting and atrophy, not elsewhere classified, multiple sites: Secondary | ICD-10-CM | POA: Diagnosis not present

## 2023-08-14 DIAGNOSIS — R2689 Other abnormalities of gait and mobility: Secondary | ICD-10-CM | POA: Diagnosis not present

## 2023-08-14 DIAGNOSIS — R2681 Unsteadiness on feet: Secondary | ICD-10-CM | POA: Diagnosis not present

## 2023-08-19 DIAGNOSIS — M79672 Pain in left foot: Secondary | ICD-10-CM | POA: Diagnosis not present

## 2023-08-19 DIAGNOSIS — M2040 Other hammer toe(s) (acquired), unspecified foot: Secondary | ICD-10-CM | POA: Diagnosis not present

## 2023-08-19 DIAGNOSIS — L600 Ingrowing nail: Secondary | ICD-10-CM | POA: Diagnosis not present

## 2023-08-19 DIAGNOSIS — B351 Tinea unguium: Secondary | ICD-10-CM | POA: Diagnosis not present

## 2023-08-19 DIAGNOSIS — I739 Peripheral vascular disease, unspecified: Secondary | ICD-10-CM | POA: Diagnosis not present

## 2023-08-19 DIAGNOSIS — M79671 Pain in right foot: Secondary | ICD-10-CM | POA: Diagnosis not present

## 2023-08-19 DIAGNOSIS — I8393 Asymptomatic varicose veins of bilateral lower extremities: Secondary | ICD-10-CM | POA: Diagnosis not present

## 2023-08-19 DIAGNOSIS — M2010 Hallux valgus (acquired), unspecified foot: Secondary | ICD-10-CM | POA: Diagnosis not present

## 2023-08-19 DIAGNOSIS — Q667 Congenital pes cavus, unspecified foot: Secondary | ICD-10-CM | POA: Diagnosis not present

## 2023-08-20 DIAGNOSIS — R278 Other lack of coordination: Secondary | ICD-10-CM | POA: Diagnosis not present

## 2023-08-20 DIAGNOSIS — M6259 Muscle wasting and atrophy, not elsewhere classified, multiple sites: Secondary | ICD-10-CM | POA: Diagnosis not present

## 2023-08-20 DIAGNOSIS — R2689 Other abnormalities of gait and mobility: Secondary | ICD-10-CM | POA: Diagnosis not present

## 2023-08-20 DIAGNOSIS — R2681 Unsteadiness on feet: Secondary | ICD-10-CM | POA: Diagnosis not present

## 2023-08-21 ENCOUNTER — Inpatient Hospital Stay: Admitting: Neurology

## 2023-08-21 DIAGNOSIS — R2689 Other abnormalities of gait and mobility: Secondary | ICD-10-CM | POA: Diagnosis not present

## 2023-08-21 DIAGNOSIS — M6259 Muscle wasting and atrophy, not elsewhere classified, multiple sites: Secondary | ICD-10-CM | POA: Diagnosis not present

## 2023-08-21 DIAGNOSIS — R278 Other lack of coordination: Secondary | ICD-10-CM | POA: Diagnosis not present

## 2023-08-21 DIAGNOSIS — R2681 Unsteadiness on feet: Secondary | ICD-10-CM | POA: Diagnosis not present

## 2023-08-25 DIAGNOSIS — R278 Other lack of coordination: Secondary | ICD-10-CM | POA: Diagnosis not present

## 2023-08-25 DIAGNOSIS — R2681 Unsteadiness on feet: Secondary | ICD-10-CM | POA: Diagnosis not present

## 2023-08-25 DIAGNOSIS — R2689 Other abnormalities of gait and mobility: Secondary | ICD-10-CM | POA: Diagnosis not present

## 2023-08-25 DIAGNOSIS — M6259 Muscle wasting and atrophy, not elsewhere classified, multiple sites: Secondary | ICD-10-CM | POA: Diagnosis not present

## 2023-08-27 DIAGNOSIS — M6259 Muscle wasting and atrophy, not elsewhere classified, multiple sites: Secondary | ICD-10-CM | POA: Diagnosis not present

## 2023-08-27 DIAGNOSIS — R2681 Unsteadiness on feet: Secondary | ICD-10-CM | POA: Diagnosis not present

## 2023-08-27 DIAGNOSIS — R278 Other lack of coordination: Secondary | ICD-10-CM | POA: Diagnosis not present

## 2023-08-27 DIAGNOSIS — R2689 Other abnormalities of gait and mobility: Secondary | ICD-10-CM | POA: Diagnosis not present

## 2023-08-28 DIAGNOSIS — M6259 Muscle wasting and atrophy, not elsewhere classified, multiple sites: Secondary | ICD-10-CM | POA: Diagnosis not present

## 2023-08-28 DIAGNOSIS — R2681 Unsteadiness on feet: Secondary | ICD-10-CM | POA: Diagnosis not present

## 2023-08-28 DIAGNOSIS — R278 Other lack of coordination: Secondary | ICD-10-CM | POA: Diagnosis not present

## 2023-08-28 DIAGNOSIS — R2689 Other abnormalities of gait and mobility: Secondary | ICD-10-CM | POA: Diagnosis not present

## 2023-09-01 DIAGNOSIS — R2689 Other abnormalities of gait and mobility: Secondary | ICD-10-CM | POA: Diagnosis not present

## 2023-09-01 DIAGNOSIS — R278 Other lack of coordination: Secondary | ICD-10-CM | POA: Diagnosis not present

## 2023-09-01 DIAGNOSIS — M6259 Muscle wasting and atrophy, not elsewhere classified, multiple sites: Secondary | ICD-10-CM | POA: Diagnosis not present

## 2023-09-01 DIAGNOSIS — R2681 Unsteadiness on feet: Secondary | ICD-10-CM | POA: Diagnosis not present

## 2023-09-02 DIAGNOSIS — F0150 Vascular dementia without behavioral disturbance: Secondary | ICD-10-CM | POA: Diagnosis not present

## 2023-09-02 DIAGNOSIS — F5104 Psychophysiologic insomnia: Secondary | ICD-10-CM | POA: Diagnosis not present

## 2023-09-02 DIAGNOSIS — Z8673 Personal history of transient ischemic attack (TIA), and cerebral infarction without residual deficits: Secondary | ICD-10-CM | POA: Diagnosis not present

## 2023-09-02 DIAGNOSIS — F0280 Dementia in other diseases classified elsewhere without behavioral disturbance: Secondary | ICD-10-CM | POA: Diagnosis not present

## 2023-09-02 DIAGNOSIS — E039 Hypothyroidism, unspecified: Secondary | ICD-10-CM | POA: Diagnosis not present

## 2023-09-08 ENCOUNTER — Telehealth: Payer: Self-pay

## 2023-09-08 NOTE — Patient Outreach (Signed)
 First telephone outreach attempt to obtain mRS. No answer. Left message for returned call.  [facility]  Population Health Care Management Assistant  Direct Dial: [phone]  Fax: [phone] Website: [email or web address]

## 2023-09-09 ENCOUNTER — Telehealth: Payer: Self-pay

## 2023-09-09 DIAGNOSIS — Z8673 Personal history of transient ischemic attack (TIA), and cerebral infarction without residual deficits: Secondary | ICD-10-CM | POA: Diagnosis not present

## 2023-09-09 DIAGNOSIS — F0150 Vascular dementia without behavioral disturbance: Secondary | ICD-10-CM | POA: Diagnosis not present

## 2023-09-09 DIAGNOSIS — R2689 Other abnormalities of gait and mobility: Secondary | ICD-10-CM | POA: Diagnosis not present

## 2023-09-09 DIAGNOSIS — R278 Other lack of coordination: Secondary | ICD-10-CM | POA: Diagnosis not present

## 2023-09-09 DIAGNOSIS — E7849 Other hyperlipidemia: Secondary | ICD-10-CM | POA: Diagnosis not present

## 2023-09-09 DIAGNOSIS — F5104 Psychophysiologic insomnia: Secondary | ICD-10-CM | POA: Diagnosis not present

## 2023-09-09 DIAGNOSIS — F0280 Dementia in other diseases classified elsewhere without behavioral disturbance: Secondary | ICD-10-CM | POA: Diagnosis not present

## 2023-09-09 DIAGNOSIS — R2681 Unsteadiness on feet: Secondary | ICD-10-CM | POA: Diagnosis not present

## 2023-09-09 DIAGNOSIS — M6259 Muscle wasting and atrophy, not elsewhere classified, multiple sites: Secondary | ICD-10-CM | POA: Diagnosis not present

## 2023-09-09 NOTE — Patient Outreach (Signed)
 Second telephone outreach attempt to obtain mRS. No answer. Left message for returned call.  [facility]  Population Health Care Management Assistant  Direct Dial: [phone]  Fax: [phone] Website: [email or web address]

## 2023-09-10 ENCOUNTER — Telehealth: Payer: Self-pay

## 2023-09-10 DIAGNOSIS — I639 Cerebral infarction, unspecified: Secondary | ICD-10-CM | POA: Diagnosis not present

## 2023-09-10 DIAGNOSIS — E039 Hypothyroidism, unspecified: Secondary | ICD-10-CM | POA: Diagnosis not present

## 2023-09-10 NOTE — Patient Outreach (Signed)
 3 outreach attempts were completed to obtain mRs. mRs could not be obtained because patient never returned my calls. mRs=7    Alexander Price [facility] Health Care Management Assistant  Direct Dial: [phone]  Fax: [phone] Website: [email or web address]

## 2023-09-12 DIAGNOSIS — R2681 Unsteadiness on feet: Secondary | ICD-10-CM | POA: Diagnosis not present

## 2023-09-12 DIAGNOSIS — R2689 Other abnormalities of gait and mobility: Secondary | ICD-10-CM | POA: Diagnosis not present

## 2023-09-12 DIAGNOSIS — R278 Other lack of coordination: Secondary | ICD-10-CM | POA: Diagnosis not present

## 2023-09-12 DIAGNOSIS — M6259 Muscle wasting and atrophy, not elsewhere classified, multiple sites: Secondary | ICD-10-CM | POA: Diagnosis not present

## 2023-09-16 DIAGNOSIS — R2689 Other abnormalities of gait and mobility: Secondary | ICD-10-CM | POA: Diagnosis not present

## 2023-09-16 DIAGNOSIS — M6259 Muscle wasting and atrophy, not elsewhere classified, multiple sites: Secondary | ICD-10-CM | POA: Diagnosis not present

## 2023-09-16 DIAGNOSIS — R278 Other lack of coordination: Secondary | ICD-10-CM | POA: Diagnosis not present

## 2023-09-16 DIAGNOSIS — R2681 Unsteadiness on feet: Secondary | ICD-10-CM | POA: Diagnosis not present

## 2023-09-24 DIAGNOSIS — E8881 Metabolic syndrome: Secondary | ICD-10-CM | POA: Diagnosis not present

## 2023-09-24 DIAGNOSIS — R030 Elevated blood-pressure reading, without diagnosis of hypertension: Secondary | ICD-10-CM | POA: Diagnosis not present

## 2023-09-24 DIAGNOSIS — E559 Vitamin D deficiency, unspecified: Secondary | ICD-10-CM | POA: Diagnosis not present

## 2023-09-24 DIAGNOSIS — F5109 Other insomnia not due to a substance or known physiological condition: Secondary | ICD-10-CM | POA: Diagnosis not present

## 2023-09-24 DIAGNOSIS — E782 Mixed hyperlipidemia: Secondary | ICD-10-CM | POA: Diagnosis not present

## 2023-09-24 DIAGNOSIS — Z993 Dependence on wheelchair: Secondary | ICD-10-CM | POA: Diagnosis not present

## 2023-09-24 DIAGNOSIS — K5901 Slow transit constipation: Secondary | ICD-10-CM | POA: Diagnosis not present

## 2023-09-24 DIAGNOSIS — E038 Other specified hypothyroidism: Secondary | ICD-10-CM | POA: Diagnosis not present

## 2023-10-07 DIAGNOSIS — F5109 Other insomnia not due to a substance or known physiological condition: Secondary | ICD-10-CM | POA: Diagnosis not present

## 2023-10-07 DIAGNOSIS — Z8673 Personal history of transient ischemic attack (TIA), and cerebral infarction without residual deficits: Secondary | ICD-10-CM | POA: Diagnosis not present

## 2023-10-07 DIAGNOSIS — E039 Hypothyroidism, unspecified: Secondary | ICD-10-CM | POA: Diagnosis not present

## 2023-10-07 DIAGNOSIS — F0150 Vascular dementia without behavioral disturbance: Secondary | ICD-10-CM | POA: Diagnosis not present

## 2023-10-07 DIAGNOSIS — F0280 Dementia in other diseases classified elsewhere without behavioral disturbance: Secondary | ICD-10-CM | POA: Diagnosis not present

## 2023-10-16 DIAGNOSIS — F03918 Unspecified dementia, unspecified severity, with other behavioral disturbance: Secondary | ICD-10-CM | POA: Diagnosis not present

## 2023-10-16 DIAGNOSIS — F0393 Unspecified dementia, unspecified severity, with mood disturbance: Secondary | ICD-10-CM | POA: Diagnosis not present

## 2023-10-16 DIAGNOSIS — F332 Major depressive disorder, recurrent severe without psychotic features: Secondary | ICD-10-CM | POA: Diagnosis not present

## 2023-10-16 DIAGNOSIS — F0394 Unspecified dementia, unspecified severity, with anxiety: Secondary | ICD-10-CM | POA: Diagnosis not present

## 2023-10-16 DIAGNOSIS — F5104 Psychophysiologic insomnia: Secondary | ICD-10-CM | POA: Diagnosis not present

## 2023-10-16 DIAGNOSIS — F419 Anxiety disorder, unspecified: Secondary | ICD-10-CM | POA: Diagnosis not present

## 2023-10-23 DIAGNOSIS — E039 Hypothyroidism, unspecified: Secondary | ICD-10-CM | POA: Diagnosis not present

## 2023-10-23 DIAGNOSIS — I639 Cerebral infarction, unspecified: Secondary | ICD-10-CM | POA: Diagnosis not present

## 2023-10-29 DIAGNOSIS — L600 Ingrowing nail: Secondary | ICD-10-CM | POA: Diagnosis not present

## 2023-10-29 DIAGNOSIS — B351 Tinea unguium: Secondary | ICD-10-CM | POA: Diagnosis not present

## 2023-10-29 DIAGNOSIS — Q667 Congenital pes cavus, unspecified foot: Secondary | ICD-10-CM | POA: Diagnosis not present

## 2023-10-29 DIAGNOSIS — M2040 Other hammer toe(s) (acquired), unspecified foot: Secondary | ICD-10-CM | POA: Diagnosis not present

## 2023-10-29 DIAGNOSIS — I739 Peripheral vascular disease, unspecified: Secondary | ICD-10-CM | POA: Diagnosis not present

## 2023-10-29 DIAGNOSIS — M79671 Pain in right foot: Secondary | ICD-10-CM | POA: Diagnosis not present

## 2023-10-29 DIAGNOSIS — M2010 Hallux valgus (acquired), unspecified foot: Secondary | ICD-10-CM | POA: Diagnosis not present

## 2023-10-29 DIAGNOSIS — I8393 Asymptomatic varicose veins of bilateral lower extremities: Secondary | ICD-10-CM | POA: Diagnosis not present

## 2023-10-29 DIAGNOSIS — M79672 Pain in left foot: Secondary | ICD-10-CM | POA: Diagnosis not present

## 2023-11-04 DIAGNOSIS — F5101 Primary insomnia: Secondary | ICD-10-CM | POA: Diagnosis not present

## 2023-11-04 DIAGNOSIS — N401 Enlarged prostate with lower urinary tract symptoms: Secondary | ICD-10-CM | POA: Diagnosis not present

## 2023-11-04 DIAGNOSIS — F0280 Dementia in other diseases classified elsewhere without behavioral disturbance: Secondary | ICD-10-CM | POA: Diagnosis not present

## 2023-11-04 DIAGNOSIS — F0150 Vascular dementia without behavioral disturbance: Secondary | ICD-10-CM | POA: Diagnosis not present

## 2023-11-19 DIAGNOSIS — F5104 Psychophysiologic insomnia: Secondary | ICD-10-CM | POA: Diagnosis not present

## 2023-11-19 DIAGNOSIS — F411 Generalized anxiety disorder: Secondary | ICD-10-CM | POA: Diagnosis not present

## 2023-11-19 DIAGNOSIS — F0394 Unspecified dementia, unspecified severity, with anxiety: Secondary | ICD-10-CM | POA: Diagnosis not present

## 2023-11-19 DIAGNOSIS — F332 Major depressive disorder, recurrent severe without psychotic features: Secondary | ICD-10-CM | POA: Diagnosis not present

## 2023-11-19 DIAGNOSIS — F03918 Unspecified dementia, unspecified severity, with other behavioral disturbance: Secondary | ICD-10-CM | POA: Diagnosis not present

## 2023-11-19 DIAGNOSIS — F0393 Unspecified dementia, unspecified severity, with mood disturbance: Secondary | ICD-10-CM | POA: Diagnosis not present

## 2023-11-20 DIAGNOSIS — E039 Hypothyroidism, unspecified: Secondary | ICD-10-CM | POA: Diagnosis not present

## 2023-11-20 DIAGNOSIS — I639 Cerebral infarction, unspecified: Secondary | ICD-10-CM | POA: Diagnosis not present

## 2023-12-02 DIAGNOSIS — E039 Hypothyroidism, unspecified: Secondary | ICD-10-CM | POA: Diagnosis not present

## 2023-12-02 DIAGNOSIS — F5101 Primary insomnia: Secondary | ICD-10-CM | POA: Diagnosis not present

## 2023-12-02 DIAGNOSIS — F0150 Vascular dementia without behavioral disturbance: Secondary | ICD-10-CM | POA: Diagnosis not present

## 2023-12-02 DIAGNOSIS — F0280 Dementia in other diseases classified elsewhere without behavioral disturbance: Secondary | ICD-10-CM | POA: Diagnosis not present

## 2023-12-05 DIAGNOSIS — I639 Cerebral infarction, unspecified: Secondary | ICD-10-CM | POA: Diagnosis not present

## 2023-12-05 DIAGNOSIS — E039 Hypothyroidism, unspecified: Secondary | ICD-10-CM | POA: Diagnosis not present

## 2023-12-23 DIAGNOSIS — F5104 Psychophysiologic insomnia: Secondary | ICD-10-CM | POA: Diagnosis not present

## 2023-12-23 DIAGNOSIS — F329 Major depressive disorder, single episode, unspecified: Secondary | ICD-10-CM | POA: Diagnosis not present

## 2023-12-23 DIAGNOSIS — F0393 Unspecified dementia, unspecified severity, with mood disturbance: Secondary | ICD-10-CM | POA: Diagnosis not present

## 2023-12-23 DIAGNOSIS — F03918 Unspecified dementia, unspecified severity, with other behavioral disturbance: Secondary | ICD-10-CM | POA: Diagnosis not present

## 2023-12-23 DIAGNOSIS — F411 Generalized anxiety disorder: Secondary | ICD-10-CM | POA: Diagnosis not present

## 2023-12-23 DIAGNOSIS — F0394 Unspecified dementia, unspecified severity, with anxiety: Secondary | ICD-10-CM | POA: Diagnosis not present

## 2024-01-06 DIAGNOSIS — F0280 Dementia in other diseases classified elsewhere without behavioral disturbance: Secondary | ICD-10-CM | POA: Diagnosis not present

## 2024-01-06 DIAGNOSIS — E039 Hypothyroidism, unspecified: Secondary | ICD-10-CM | POA: Diagnosis not present

## 2024-01-06 DIAGNOSIS — F0150 Vascular dementia without behavioral disturbance: Secondary | ICD-10-CM | POA: Diagnosis not present

## 2024-01-06 DIAGNOSIS — F5101 Primary insomnia: Secondary | ICD-10-CM | POA: Diagnosis not present

## 2024-01-14 DIAGNOSIS — Z993 Dependence on wheelchair: Secondary | ICD-10-CM | POA: Diagnosis not present

## 2024-01-14 DIAGNOSIS — G219 Secondary parkinsonism, unspecified: Secondary | ICD-10-CM | POA: Diagnosis not present

## 2024-01-14 DIAGNOSIS — R030 Elevated blood-pressure reading, without diagnosis of hypertension: Secondary | ICD-10-CM | POA: Diagnosis not present

## 2024-01-14 DIAGNOSIS — E8881 Metabolic syndrome: Secondary | ICD-10-CM | POA: Diagnosis not present

## 2024-01-15 DIAGNOSIS — F0393 Unspecified dementia, unspecified severity, with mood disturbance: Secondary | ICD-10-CM | POA: Diagnosis not present

## 2024-01-15 DIAGNOSIS — F03918 Unspecified dementia, unspecified severity, with other behavioral disturbance: Secondary | ICD-10-CM | POA: Diagnosis not present

## 2024-01-15 DIAGNOSIS — F0394 Unspecified dementia, unspecified severity, with anxiety: Secondary | ICD-10-CM | POA: Diagnosis not present

## 2024-01-15 DIAGNOSIS — F411 Generalized anxiety disorder: Secondary | ICD-10-CM | POA: Diagnosis not present

## 2024-01-15 DIAGNOSIS — F329 Major depressive disorder, single episode, unspecified: Secondary | ICD-10-CM | POA: Diagnosis not present

## 2024-01-21 DIAGNOSIS — I639 Cerebral infarction, unspecified: Secondary | ICD-10-CM | POA: Diagnosis not present

## 2024-01-21 DIAGNOSIS — E039 Hypothyroidism, unspecified: Secondary | ICD-10-CM | POA: Diagnosis not present

## 2024-01-22 DIAGNOSIS — F0393 Unspecified dementia, unspecified severity, with mood disturbance: Secondary | ICD-10-CM | POA: Diagnosis not present

## 2024-01-22 DIAGNOSIS — F03918 Unspecified dementia, unspecified severity, with other behavioral disturbance: Secondary | ICD-10-CM | POA: Diagnosis not present

## 2024-01-22 DIAGNOSIS — F5104 Psychophysiologic insomnia: Secondary | ICD-10-CM | POA: Diagnosis not present

## 2024-01-22 DIAGNOSIS — F332 Major depressive disorder, recurrent severe without psychotic features: Secondary | ICD-10-CM | POA: Diagnosis not present

## 2024-01-22 DIAGNOSIS — F411 Generalized anxiety disorder: Secondary | ICD-10-CM | POA: Diagnosis not present

## 2024-01-22 DIAGNOSIS — F0394 Unspecified dementia, unspecified severity, with anxiety: Secondary | ICD-10-CM | POA: Diagnosis not present

## 2024-01-27 DIAGNOSIS — E782 Mixed hyperlipidemia: Secondary | ICD-10-CM | POA: Diagnosis not present

## 2024-01-27 DIAGNOSIS — E039 Hypothyroidism, unspecified: Secondary | ICD-10-CM | POA: Diagnosis not present

## 2024-01-27 DIAGNOSIS — Z8673 Personal history of transient ischemic attack (TIA), and cerebral infarction without residual deficits: Secondary | ICD-10-CM | POA: Diagnosis not present

## 2024-01-27 DIAGNOSIS — F0280 Dementia in other diseases classified elsewhere without behavioral disturbance: Secondary | ICD-10-CM | POA: Diagnosis not present

## 2024-01-27 DIAGNOSIS — F0150 Vascular dementia without behavioral disturbance: Secondary | ICD-10-CM | POA: Diagnosis not present

## 2024-02-03 DIAGNOSIS — B351 Tinea unguium: Secondary | ICD-10-CM | POA: Diagnosis not present

## 2024-02-03 DIAGNOSIS — L600 Ingrowing nail: Secondary | ICD-10-CM | POA: Diagnosis not present

## 2024-02-03 DIAGNOSIS — M2010 Hallux valgus (acquired), unspecified foot: Secondary | ICD-10-CM | POA: Diagnosis not present

## 2024-02-03 DIAGNOSIS — I8393 Asymptomatic varicose veins of bilateral lower extremities: Secondary | ICD-10-CM | POA: Diagnosis not present

## 2024-02-03 DIAGNOSIS — M2040 Other hammer toe(s) (acquired), unspecified foot: Secondary | ICD-10-CM | POA: Diagnosis not present

## 2024-02-03 DIAGNOSIS — M79672 Pain in left foot: Secondary | ICD-10-CM | POA: Diagnosis not present

## 2024-02-03 DIAGNOSIS — M79671 Pain in right foot: Secondary | ICD-10-CM | POA: Diagnosis not present

## 2024-02-03 DIAGNOSIS — I739 Peripheral vascular disease, unspecified: Secondary | ICD-10-CM | POA: Diagnosis not present

## 2024-02-03 DIAGNOSIS — Q667 Congenital pes cavus, unspecified foot: Secondary | ICD-10-CM | POA: Diagnosis not present

## 2024-02-08 DIAGNOSIS — H9193 Unspecified hearing loss, bilateral: Secondary | ICD-10-CM | POA: Diagnosis not present

## 2024-02-08 DIAGNOSIS — H6123 Impacted cerumen, bilateral: Secondary | ICD-10-CM | POA: Diagnosis not present

## 2024-02-24 DIAGNOSIS — F5101 Primary insomnia: Secondary | ICD-10-CM | POA: Diagnosis not present

## 2024-02-24 DIAGNOSIS — F0280 Dementia in other diseases classified elsewhere without behavioral disturbance: Secondary | ICD-10-CM | POA: Diagnosis not present

## 2024-02-24 DIAGNOSIS — E039 Hypothyroidism, unspecified: Secondary | ICD-10-CM | POA: Diagnosis not present

## 2024-02-24 DIAGNOSIS — F0150 Vascular dementia without behavioral disturbance: Secondary | ICD-10-CM | POA: Diagnosis not present
